# Patient Record
Sex: Female | Born: 1945 | Race: White | Hispanic: No | Marital: Single | Smoking: Former smoker
Health system: Southern US, Community
[De-identification: ages and names within clinical notes are randomized; demographics above are authoritative.]

## PROBLEM LIST (undated history)

## (undated) DIAGNOSIS — R600 Localized edema: Secondary | ICD-10-CM

## (undated) DIAGNOSIS — I8390 Asymptomatic varicose veins of unspecified lower extremity: Secondary | ICD-10-CM

## (undated) DIAGNOSIS — G8929 Other chronic pain: Secondary | ICD-10-CM

## (undated) DIAGNOSIS — R32 Unspecified urinary incontinence: Secondary | ICD-10-CM

## (undated) DIAGNOSIS — M1990 Unspecified osteoarthritis, unspecified site: Secondary | ICD-10-CM

## (undated) DIAGNOSIS — I872 Venous insufficiency (chronic) (peripheral): Secondary | ICD-10-CM

## (undated) DIAGNOSIS — R519 Headache, unspecified: Secondary | ICD-10-CM

## (undated) DIAGNOSIS — I809 Phlebitis and thrombophlebitis of unspecified site: Secondary | ICD-10-CM

## (undated) DIAGNOSIS — R51 Headache: Secondary | ICD-10-CM

## (undated) DIAGNOSIS — H269 Unspecified cataract: Secondary | ICD-10-CM

## (undated) DIAGNOSIS — R0602 Shortness of breath: Secondary | ICD-10-CM

## (undated) DIAGNOSIS — I493 Ventricular premature depolarization: Secondary | ICD-10-CM

## (undated) HISTORY — DX: Ventricular premature depolarization: I49.3

## (undated) HISTORY — DX: Shortness of breath: R06.02

## (undated) HISTORY — PX: OTHER SURGICAL HISTORY: SHX169

## (undated) HISTORY — DX: Unspecified urinary incontinence: R32

## (undated) HISTORY — PX: NOSE SURGERY: SHX723

## (undated) HISTORY — DX: Unspecified osteoarthritis, unspecified site: M19.90

## (undated) HISTORY — DX: Other chronic pain: G89.29

## (undated) HISTORY — DX: Headache, unspecified: R51.9

## (undated) HISTORY — DX: Venous insufficiency (chronic) (peripheral): I87.2

## (undated) HISTORY — DX: Asymptomatic varicose veins of unspecified lower extremity: I83.90

## (undated) HISTORY — PX: HERNIA REPAIR: SHX51

## (undated) HISTORY — DX: Localized edema: R60.0

## (undated) HISTORY — DX: Unspecified cataract: H26.9

## (undated) HISTORY — DX: Phlebitis and thrombophlebitis of unspecified site: I80.9

## (undated) HISTORY — DX: Headache: R51

## (undated) HISTORY — PX: CHOLECYSTECTOMY: SHX55

---

## 1974-12-06 HISTORY — PX: TUBAL LIGATION: SHX77

## 1998-03-25 ENCOUNTER — Other Ambulatory Visit: Admission: RE | Admit: 1998-03-25 | Discharge: 1998-03-25 | Payer: Self-pay | Admitting: *Deleted

## 1998-04-14 ENCOUNTER — Other Ambulatory Visit: Admission: RE | Admit: 1998-04-14 | Discharge: 1998-04-14 | Payer: Self-pay | Admitting: *Deleted

## 2000-12-06 HISTORY — PX: KNEE ARTHROSCOPY: SUR90

## 2001-04-23 ENCOUNTER — Ambulatory Visit (HOSPITAL_COMMUNITY): Admission: RE | Admit: 2001-04-23 | Discharge: 2001-04-23 | Payer: Self-pay | Admitting: Orthopedic Surgery

## 2001-04-23 ENCOUNTER — Encounter: Payer: Self-pay | Admitting: Orthopedic Surgery

## 2001-06-02 ENCOUNTER — Ambulatory Visit (HOSPITAL_COMMUNITY): Admission: RE | Admit: 2001-06-02 | Discharge: 2001-06-02 | Payer: Self-pay | Admitting: Orthopedic Surgery

## 2004-07-11 ENCOUNTER — Emergency Department (HOSPITAL_COMMUNITY): Admission: EM | Admit: 2004-07-11 | Discharge: 2004-07-11 | Payer: Self-pay | Admitting: Emergency Medicine

## 2005-01-13 ENCOUNTER — Encounter: Payer: Self-pay | Admitting: *Deleted

## 2005-12-12 ENCOUNTER — Emergency Department (HOSPITAL_COMMUNITY): Admission: EM | Admit: 2005-12-12 | Discharge: 2005-12-12 | Payer: Self-pay | Admitting: Emergency Medicine

## 2008-02-13 ENCOUNTER — Ambulatory Visit (HOSPITAL_BASED_OUTPATIENT_CLINIC_OR_DEPARTMENT_OTHER): Admission: RE | Admit: 2008-02-13 | Discharge: 2008-02-13 | Payer: Self-pay | Admitting: Urology

## 2008-03-29 ENCOUNTER — Ambulatory Visit (HOSPITAL_BASED_OUTPATIENT_CLINIC_OR_DEPARTMENT_OTHER): Admission: RE | Admit: 2008-03-29 | Discharge: 2008-03-29 | Payer: Self-pay | Admitting: Urology

## 2008-05-07 ENCOUNTER — Ambulatory Visit (HOSPITAL_BASED_OUTPATIENT_CLINIC_OR_DEPARTMENT_OTHER): Admission: RE | Admit: 2008-05-07 | Discharge: 2008-05-07 | Payer: Self-pay | Admitting: Urology

## 2008-09-06 ENCOUNTER — Emergency Department (HOSPITAL_COMMUNITY): Admission: EM | Admit: 2008-09-06 | Discharge: 2008-09-07 | Payer: Self-pay | Admitting: Emergency Medicine

## 2010-09-28 ENCOUNTER — Encounter: Admission: RE | Admit: 2010-09-28 | Discharge: 2010-09-28 | Payer: Self-pay | Admitting: Obstetrics and Gynecology

## 2010-11-10 ENCOUNTER — Encounter: Admission: RE | Admit: 2010-11-10 | Discharge: 2010-11-10 | Payer: Self-pay | Admitting: Psychiatry

## 2011-04-14 ENCOUNTER — Encounter (INDEPENDENT_AMBULATORY_CARE_PROVIDER_SITE_OTHER): Payer: Self-pay | Admitting: General Surgery

## 2011-04-20 NOTE — Op Note (Signed)
NAME:  Meghan Griffith, Meghan Griffith          ACCOUNT NO.:  [id number]   MEDICAL RECORD NO.:  [id number]          PATIENT TYPE:  AMB   LOCATION:  [facility]                         FACILITY:  [facility]   PHYSICIAN:  Martina Sinner, MD DATE OF BIRTH:  1946/04/27   DATE OF PROCEDURE:  DATE OF DISCHARGE:                               OPERATIVE REPORT   PREOPERATIVE DIAGNOSIS:  Stress incontinence.   POSTOPERATIVE DIAGNOSIS:  Stress incontinence.   SURGERY:  Cystoscopy, transurethral collagen injection therapy.   SURGEON:  Martina Sinner, MD   Ms. Nase responded well to her first collagen treatment, and we  are trying to get her as dry as possible.   The patient was prepped and draped in the usual fashion.  The ACMI scope  was used for the examination.  Bladder mucosa and trigone were normal.  There is no __________ deformity or bladder carcinoma.  She had a very  long urethra with descensus.  I could see residual collagen.  I injected  it at 5 and 7 o'clock, two syringes of collagen.  I was very happy with  the coaptation.  The bladder was emptied with a red rubber catheter.  Hopefully, this will help her reach her treatment goal.           ______________________________  Martina Sinner, MD  Electronically Signed     SAM/MEDQ  D:  05/07/2008  T:  05/07/2008  Job:  [id number]

## 2011-04-20 NOTE — Op Note (Signed)
NAME:  Meghan Griffith, Meghan Griffith          ACCOUNT NO.:  [id number]   MEDICAL RECORD NO.:  [id number]          PATIENT TYPE:  AMB   LOCATION:  [facility]                         FACILITY:  [facility]   PHYSICIAN:  Martina Sinner, MD DATE OF BIRTH:  October 25, 1946   DATE OF PROCEDURE:  03/29/2008  DATE OF DISCHARGE:                               OPERATIVE REPORT   PREOPERATIVE DIAGNOSIS:  Stress incontinence.   POSTOPERATIVE DIAGNOSIS:  Stress incontinence.   SURGERY:  Cystoscopy, transurethral collagen injection therapy.   SURGEON:  Martina Sinner, M.D.   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   She was prepped and draped in the usual fashion.  The ACMI scope was  used for the examination.  There was no erosion into the urethra or  bladder.  Bladder mucosa and trigone were normal.  I injected three  syringes of collagen at 5 and 7 o'clock.  The first and third syringe  went beautifully with excellent coaptation.   A 14-French red rubber catheter was inserted into her bladder and her  bladder was emptied.  The patient was covered with antibiotics and taken  to the recovery room.           ______________________________  Martina Sinner, MD  Electronically Signed     SAM/MEDQ  D:  03/29/2008  T:  03/29/2008  Job:  [id number]

## 2011-04-20 NOTE — Op Note (Signed)
NAME:  Meghan Griffith, Meghan Griffith          ACCOUNT NO.:  [id number]   MEDICAL RECORD NO.:  [id number]          PATIENT TYPE:  AMB   LOCATION:  [facility]                         FACILITY:  [facility]   PHYSICIAN:  Martina Sinner, MD DATE OF BIRTH:  11/12/1946   DATE OF PROCEDURE:  02/13/2008  DATE OF DISCHARGE:                               OPERATIVE REPORT   PREOPERATIVE DIAGNOSIS:  Stress incontinence.   POSTOPERATIVE DIAGNOSIS:  Stress incontinence.   OPERATION:  Sling cystourethropexy [facility]) plus cystoscopy.   SURGEON:  Martina Sinner, M.D.   ASSISTANT:  Delman Kitten   INDICATIONS FOR PROCEDURE:  Meghan Griffith has mixed stress and urge  incontinence, but primarily stress incontinence refractory to medical  and behavioral therapy.  She consented to a sling.   PROCEDURE IN DETAIL:  The patient is prepped and draped in the usual  fashion.  Preoperative laboratory tests were normal.  She was given  preoperative antibiotics.  Extra care was taken with leg positioning to  minimize the risks of compartment syndrome, neuropathy and DVT.  I  initially made two 1 cm incisions 1.5 cm lateral to the midline one  fingerbreadth above the symphysis pubis.  I then made a 2 cm incision  overlying the mid urethra.  I used approximately 7 mL of lidocaine and  epinephrine mixture.  I made a thick incision to allow for a thick  pubocervical flap.  I sharply dissected to the urethrovesical angle  bilaterally.   With the bladder empty, I passed a SPARC needle on top of and along the  back of the symphysis pubis parallel to the midline.  I delivered the  trocar onto the pulp of my index finger.  I did the same procedure on  both sides.  I then cystoscoped the patient.  There was no injury to the  bladder.  There was no deflection of the bladder with wiggling of the  trocar.  There was efflux of indigo carmine bilaterally.  Examination of  the urethra was normal.  With the bladder empty, I attached the  [facility]  sling and brought it up through the retropubic space.  I tensioned it  over fat part of a Kelly clamp.  I cut below the blue dots, irrigated  the sheath, and removed the sheath.  I was very happy with the tension  and position of the sling.  There was hypermobility in the midline.  There was no spring back effect.   I closed the anterior vaginal wall incision with running 2-0 Vicryl on a  CT1 needle.  I used two interrupted sutures.  I cut the sling below the  skin and closed the skin with 4-0 Vicryl subcuticular and Dermabond.  I  was very pleased  with the procedure.  Blood loss was less than 50 mL.  A vaginal pack was  inserted.  The Foley catheter was clamped off at the end of the case  with an empty bladder.  The leg position was good.  I am hoping this  operation will reach the patient's treatment goal.           ______________________________  Martina Sinner, MD  Electronically Signed     SAM/MEDQ  D:  02/13/2008  T:  02/13/2008  Job:  [id number]

## 2011-07-19 ENCOUNTER — Ambulatory Visit (HOSPITAL_COMMUNITY)
Admission: RE | Admit: 2011-07-19 | Discharge: 2011-07-19 | Disposition: A | Discharge status: Home / Self Care | Payer: BC Managed Care – PPO | Source: Ambulatory Visit | Attending: General Surgery | Admitting: General Surgery

## 2011-07-19 ENCOUNTER — Other Ambulatory Visit (INDEPENDENT_AMBULATORY_CARE_PROVIDER_SITE_OTHER): Payer: Self-pay | Admitting: General Surgery

## 2011-07-19 ENCOUNTER — Encounter (HOSPITAL_COMMUNITY)
Admission: RE | Admit: 2011-07-19 | Discharge: 2011-07-19 | Disposition: A | Discharge status: Home / Self Care | Payer: BC Managed Care – PPO | Source: Ambulatory Visit | Attending: General Surgery | Admitting: General Surgery

## 2011-07-19 DIAGNOSIS — K829 Disease of gallbladder, unspecified: Secondary | ICD-10-CM | POA: Insufficient documentation

## 2011-07-19 DIAGNOSIS — Z01811 Encounter for preprocedural respiratory examination: Secondary | ICD-10-CM

## 2011-07-19 DIAGNOSIS — Z01810 Encounter for preprocedural cardiovascular examination: Secondary | ICD-10-CM | POA: Insufficient documentation

## 2011-07-19 DIAGNOSIS — I498 Other specified cardiac arrhythmias: Secondary | ICD-10-CM | POA: Insufficient documentation

## 2011-07-19 DIAGNOSIS — Z01812 Encounter for preprocedural laboratory examination: Secondary | ICD-10-CM | POA: Insufficient documentation

## 2011-07-19 DIAGNOSIS — Z01818 Encounter for other preprocedural examination: Secondary | ICD-10-CM | POA: Insufficient documentation

## 2011-07-19 LAB — DIFFERENTIAL
Basophils Absolute: 0 10*3/uL (ref 0.0–0.1)
Basophils Relative: 0 % (ref 0–1)
Eosinophils Absolute: 0.1 10*3/uL (ref 0.0–0.7)
Eosinophils Relative: 1 % (ref 0–5)
Lymphocytes Relative: 25 % (ref 12–46)
Lymphs Abs: 2.3 10*3/uL (ref 0.7–4.0)
Monocytes Absolute: 0.5 10*3/uL (ref 0.1–1.0)
Monocytes Relative: 5 % (ref 3–12)
Neutro Abs: 6.1 10*3/uL (ref 1.7–7.7)
Neutrophils Relative %: 68 % (ref 43–77)

## 2011-07-19 LAB — COMPREHENSIVE METABOLIC PANEL
AST: 19 U/L (ref 0–37)
Albumin: 4.3 g/dL (ref 3.5–5.2)
Alkaline Phosphatase: 107 U/L (ref 39–117)
CO2: 26 mEq/L (ref 19–32)
Chloride: 105 mEq/L (ref 96–112)
Potassium: 4 mEq/L (ref 3.5–5.1)
Total Bilirubin: 0.3 mg/dL (ref 0.3–1.2)

## 2011-07-19 LAB — CBC
HCT: 43.4 % (ref 36.0–46.0)
Platelets: 133 10*3/uL — ABNORMAL LOW (ref 150–400)
RBC: 4.9 MIL/uL (ref 3.87–5.11)
RDW: 13.1 % (ref 11.5–15.5)
WBC: 9 10*3/uL (ref 4.0–10.5)

## 2011-07-22 ENCOUNTER — Telehealth: Payer: Self-pay | Admitting: *Deleted

## 2011-07-22 NOTE — Telephone Encounter (Signed)
Call Back Phone#: [phone] Last OV

## 2011-07-26 ENCOUNTER — Telehealth (INDEPENDENT_AMBULATORY_CARE_PROVIDER_SITE_OTHER): Payer: Self-pay | Admitting: General Surgery

## 2011-07-26 NOTE — Telephone Encounter (Signed)
I faxed everything (OV, ECHO and NUC) possible on 07/23/11

## 2011-07-26 NOTE — Telephone Encounter (Signed)
Labs ok for surgery faxed to [facility].

## 2011-07-26 NOTE — Telephone Encounter (Signed)
Message copied by Liliana Cline on Mon Jul 26, 2011  2:59 PM ------      Message from: Andrey Campanile, ERIC M      Created: Mon Jul 26, 2011  2:31 PM       Labs ok for surgery

## 2011-07-30 ENCOUNTER — Ambulatory Visit (HOSPITAL_COMMUNITY): Payer: BC Managed Care – PPO

## 2011-07-30 ENCOUNTER — Other Ambulatory Visit (INDEPENDENT_AMBULATORY_CARE_PROVIDER_SITE_OTHER): Payer: Self-pay | Admitting: General Surgery

## 2011-07-30 ENCOUNTER — Ambulatory Visit (HOSPITAL_COMMUNITY)
Admission: RE | Admit: 2011-07-30 | Discharge: 2011-07-30 | Disposition: A | Discharge status: Home / Self Care | Payer: BC Managed Care – PPO | Source: Ambulatory Visit | Attending: General Surgery | Admitting: General Surgery

## 2011-07-30 DIAGNOSIS — K8020 Calculus of gallbladder without cholecystitis without obstruction: Secondary | ICD-10-CM | POA: Insufficient documentation

## 2011-07-30 DIAGNOSIS — F17200 Nicotine dependence, unspecified, uncomplicated: Secondary | ICD-10-CM | POA: Insufficient documentation

## 2011-07-30 DIAGNOSIS — K8010 Calculus of gallbladder with chronic cholecystitis without obstruction: Secondary | ICD-10-CM

## 2011-07-30 HISTORY — PX: LAPAROSCOPIC CHOLECYSTECTOMY W/ CHOLANGIOGRAPHY: SUR757

## 2011-08-02 ENCOUNTER — Telehealth (INDEPENDENT_AMBULATORY_CARE_PROVIDER_SITE_OTHER): Payer: Self-pay | Admitting: General Surgery

## 2011-08-02 NOTE — Telephone Encounter (Signed)
appt made for 08/13/11 @ 11 am.

## 2011-08-12 NOTE — Op Note (Signed)
NAMEMarland Kitchen  Meghan Griffith, Meghan Griffith          ACCOUNT NO.:  [id number]  MEDICAL RECORD NO.:  [id number]  LOCATION:  [facility]                         FACILITY:  [facility]  PHYSICIAN:  [name]. Andrey Campanile, MD     DATE OF BIRTH:  07-14-1946  DATE OF PROCEDURE:  07/30/2011 DATE OF DISCHARGE:                              OPERATIVE REPORT   PREOPERATIVE DIAGNOSIS:  Symptomatic cholelithiasis.  POSTOPERATIVE DIAGNOSIS:  Symptomatic cholelithiasis.  PROCEDURE:  Laparoscopic cholecystectomy with intraoperative cholangiogram.  SURGEON:  [name]. Andrey Campanile, MD  ASSISTANT SURGEON:  None.  ANESTHESIA:  General plus 18 mL of 0.25% Marcaine with epi.  SPECIMEN:  Gallbladder.  ESTIMATED BLOOD LOSS:  Minimal.  FINDINGS:  The patient had a fair number of omental attachments to the body of the gallbladder.  The critical view was obtained.  The cholangiogram demonstrated prompt opacification of the cystic duct, common hepatic, common bile duct, left and right hepatic ducts as well as emptying into the duodenum.  There was no evidence of filling defects.  There was a little bit of extravasation of contrast as it entered the cystic duct.  I did place surgical snow in the gallbladder fossa.  DRAINS:  None.  INDICATIONS FOR PROCEDURE:  The patient is a [name] who has had intermittent abdominal pain for several years now. The pain is mainly located on the right side underneath the rib cage and radiates to her epigastrium.  It can be related to certain foods particularly spicy foods.  She underwent an ultrasound, which demonstrated multiple gallstones.  We discussed the risks and benefits of surgery including, but not limited to bleeding, infection, injury to surrounding structures, bile leak, prolonged diarrhea, injury to the common bile duct requiring major reconstructive bile duct surgery, retained gallstones, bile leak, DVT occurrence, and general anesthesia risk.  The patient elects to  proceed to surgery.  DESCRIPTION OF PROCEDURE:  After obtaining informed consent, the patient was brought to the operating room, placed supine on the operating table. General endotracheal anesthesia was established.  Sequential compression devices were placed.  Surgical time-out was performed.  She received antibiotics prior to skin incision.  Abdomen was prepped and draped in usual standard surgical fashion with ChloraPrep.  I infiltrated local at the base of her umbilicus.  Next, a 1.5 cm vertical infraumbilical incision was made with an #11 blade.  The fascia was grasped and lifted anteriorly.  The fascia was incised with #11-blade.  The abdominal cavity was entered.  A pursestring suture consisting of 0-Vicryl was placed around the fascial edges and a 12-mm Hasson trocar was placed under direct visualization into the abdominal cavity.  Pneumoperitoneum was smoothly established up to a patient pressure of 15 mmHg. Laparoscope was advanced.  The patient was placed in reverse Trendelenburg and rotated to the left.  Three additional 5-mm trocars were placed all under direct visualization after local had been infiltrated, one in the subxiphoid position and two in the right hypochondrium.  The gallbladder was grasped and lifted up.  She had a fair amount of omental attachments to the body of the gallbladder.  This was gently stripped down in a blunt fashion as well as taken down with hook electrocautery.  The  neck was grasped and retracted laterally.  I then incised the peritoneum overlying the medial and lateral aspects of the gallbladder.  Then using a [place], I circumferentially dissected out the cystic duct as well as the cystic artery.  The critical view was obtained.  A clip was placed across the distal cystic duct as it entered the gallbladder.  It was then partially transected. [facility] cholangiogram catheter was then placed percutaneously through the abdominal wall and  threaded into the cystic duct and secured with a clip.  The cholangiogram was performed as described above. Pneumoperitoneum was reestablished.  The clip securing the catheter was removed along with the cholangiogram catheter.  Three clips were placed on the biliary aspect of the gallbladder on the cystic duct.  It was then transected with EndoShears.  Two clips were placed on the proximal side of the cystic artery and then it was transected distally with hook electrocautery.  I then rolled the gallbladder out of the gallbladder fossa.  As we neared the top of the edge of the liver, there was some bleeding from the gallbladder fossa.  I finished removing the gallbladder from the gallbladder fossa.  The laparoscope was placed in the subxiphoid trocar.  The endobag was advanced to the umbilical trocar.  This gallbladder was placed in the specimen bag and removed the abdominal cavity.  Pneumoperitoneum was reestablished.  I then lifted up the liver.  There were two areas that were oozing so I turned up the hook electrocautery to 60 and obtained hemostasis.  I then irrigated the right upper quadrant with a liter and half of saline.  There was no evidence of bile leak.  The bleeding was controlled, however, I did elect to place a piece of surgical snow in the gallbladder fossa. I then removed the Hasson trocar and tied down the previously placed pursestring suture.  I placed an additional single interrupted 0-Vicryl suture, this was performed and viewed laparoscopically.  There was nothing within our fascial closure.  There was no air leak at the umbilicus.  Pneumoperitoneum was released.  Remaining skin incisions were closed with 4-0 Monocryl in subcuticular fashion followed by application of Benzoin, Steri-Strips, and sterile bandages.  The patient was extubated and taken to the recovery room in a stable condition. There were no immediate complications.  All needle, instrument, and sponge  counts were correct x2.     [name]. Andrey Campanile, MD     EMW/MEDQ  D:  07/30/2011  T:  07/30/2011  Job:  [id number]  cc:   Ralene Ok, M.D.  Electronically Signed by Gaynelle Adu M.D. on 08/12/2011 02:04:56 PM

## 2011-08-13 ENCOUNTER — Ambulatory Visit (INDEPENDENT_AMBULATORY_CARE_PROVIDER_SITE_OTHER): Payer: BC Managed Care – PPO | Admitting: General Surgery

## 2011-08-13 ENCOUNTER — Encounter (INDEPENDENT_AMBULATORY_CARE_PROVIDER_SITE_OTHER): Payer: Self-pay | Admitting: General Surgery

## 2011-08-13 VITALS — BP 134/96 | HR 64

## 2011-08-13 DIAGNOSIS — Z09 Encounter for follow-up examination after completed treatment for conditions other than malignant neoplasm: Secondary | ICD-10-CM

## 2011-08-13 NOTE — Patient Instructions (Signed)
Try taking a stool softner for several days & increasing your water intake to help with your constipation

## 2011-08-13 NOTE — Progress Notes (Signed)
Chief complaint: Postop  Procedure: Laparoscopic cholecystectomy with interoperative cholangiogram on July 30, 2011  History of Present Ilness: [name] comes in today for her first postoperative appointment. She states that she's been very well since surgery. She denies any fevers or chills. She denies any nausea or vomiting. She denies any abdominal pain. She reports a good appetite. She reports a little constipation. She states that she's having a bowel movement every other day. She denies taking any narcotics.  Physical Exam: BP 134/96  Pulse 64  Well-developed well-nourished overweight Caucasian female in no apparent distress Pulmonary-lungs are clear to auscultation Cardiac-regular rate and rhythm Abdomen-soft, nontender, nondistended. Well-healed trocar incisions. No signs of cellulitis or surgical site wound infection. No signs of incisional hernia Skin-no jaundice or edema  Pathology: Pathology showed chronic cholecystitis and cholelithiasis  Assessment and Plan: Status post laparoscopic cholecystectomy with intraoperative cholangiogram-doing well  We discussed her pathology report.  I advised her to take stool softners & increase her water intake to help with her infrequent stools.  I gave her a note returning to work next week.  I will see on a prn basis

## 2011-08-30 LAB — TYPE AND SCREEN: ABO/RH(D): O POS

## 2011-08-30 LAB — CBC
HCT: 42
Hemoglobin: 14.9
MCHC: 35.4
MCV: 86.8
RDW: 13.3

## 2011-08-30 LAB — DIFFERENTIAL
Basophils Absolute: 0
Basophils Relative: 0
Eosinophils Absolute: 0.1
Eosinophils Relative: 1
Monocytes Absolute: 0.5

## 2011-08-30 LAB — ABO/RH: ABO/RH(D): O POS

## 2011-08-31 LAB — POCT HEMOGLOBIN-HEMACUE: Operator id: 268271

## 2011-09-02 LAB — POCT I-STAT, CHEM 8
Glucose, Bld: 82
HCT: 53 — ABNORMAL HIGH
Hemoglobin: 18 — ABNORMAL HIGH
Potassium: 3.2 — ABNORMAL LOW
Sodium: 145

## 2011-09-06 LAB — DIFFERENTIAL
Basophils Relative: 1
Eosinophils Absolute: 0
Eosinophils Relative: 0
Monocytes Absolute: 0.4
Monocytes Relative: 4

## 2011-09-06 LAB — CBC
Hemoglobin: 15
RDW: 13.6

## 2011-09-06 LAB — COMPREHENSIVE METABOLIC PANEL
ALT: 69 — ABNORMAL HIGH
AST: 156 — ABNORMAL HIGH
Albumin: 3.9
Alkaline Phosphatase: 102
Glucose, Bld: 116 — ABNORMAL HIGH
Potassium: 4.7
Sodium: 135
Total Protein: 7

## 2012-01-25 ENCOUNTER — Encounter: Payer: Self-pay | Admitting: Cardiology

## 2012-01-26 ENCOUNTER — Ambulatory Visit (INDEPENDENT_AMBULATORY_CARE_PROVIDER_SITE_OTHER): Payer: BC Managed Care – PPO | Admitting: Pulmonary Disease

## 2012-01-26 ENCOUNTER — Encounter: Payer: Self-pay | Admitting: Pulmonary Disease

## 2012-01-26 VITALS — BP 120/86 | HR 74 | Temp 97.4°F | Ht 67.0 in | Wt 200.2 lb

## 2012-01-26 DIAGNOSIS — R0600 Dyspnea, unspecified: Secondary | ICD-10-CM

## 2012-01-26 DIAGNOSIS — R0609 Other forms of dyspnea: Secondary | ICD-10-CM

## 2012-01-26 DIAGNOSIS — R0989 Other specified symptoms and signs involving the circulatory and respiratory systems: Secondary | ICD-10-CM

## 2012-01-26 NOTE — Patient Instructions (Signed)
Your breathing tests and oxygen levels are normal today.  You do not have copd. Will check scan of your chest to make sure you have not had blood clots to lungs.  Will call you with results. If this is unremarkable, and your shortness of breath persists, you may need a cardiac evaluation thru your primary doctor.

## 2012-01-26 NOTE — Progress Notes (Signed)
  Subjective:    Patient ID: Meghan Griffith, female    DOB: 1946/10/05, 66 y.o.   MRN: [id number]  HPI The patient is a 66 year old female who I've been asked to see for dyspnea on exertion.  The patient has a long-standing history of tobacco use, however felt that her breathing was fairly normal until approximately 3 days ago.  She began to have dyspnea on exertion to the point that she had difficulty keeping up with the walking involved in her job.  She does not feel that her shortness of breath has been incremental over the last 3 days.  She denies any shortness of breath at rest.  She has a mild chronic cough that is dry, and it has not changed.  She denies any chest pressure, pleuritic chest pain, or wheezing.  She does have some chronic lower extremity edema due to a history of phlebitis, but has never had a DVT or pulmonary embolus.  She denies any known history of heart disease.  She did bring a disc of her recent chest x-ray, and this is over penetrated.  It does raise the question of apical bulla, but does not appear to have any acute process.   Review of Systems  Constitutional: Positive for unexpected weight change. Negative for fever.  HENT: Positive for ear pain and sneezing. Negative for nosebleeds, congestion, sore throat, rhinorrhea, trouble swallowing, dental problem, postnasal drip and sinus pressure.   Eyes: Negative for redness and itching.  Respiratory: Positive for cough and shortness of breath. Negative for chest tightness and wheezing.   Cardiovascular: Negative for palpitations and leg swelling.  Gastrointestinal: Negative for nausea and vomiting.  Genitourinary: Negative for dysuria.  Musculoskeletal: Positive for joint swelling.  Skin: Negative for rash.  Neurological: Positive for headaches.  Hematological: Does not bruise/bleed easily.  Psychiatric/Behavioral: Negative for dysphoric mood. The patient is not nervous/anxious.        Objective:   Physical  Exam Constitutional:  Well developed, no acute distress  HENT:  Nares patent without discharge  Oropharynx without exudate, palate and uvula are normal  Eyes:  Perrla, eomi, no scleral icterus  Neck:  No JVD, no TMG  Cardiovascular:  Normal rate, regular rhythm, no rubs or gallops.  No murmurs        Intact distal pulses  Pulmonary :  Normal breath sounds, no stridor or respiratory distress   No rales, rhonchi, or wheezing  Abdominal:  Soft, nondistended, bowel sounds present.  No tenderness noted.   Musculoskeletal: 1+ lower extremity edema noted, prominent varicosities.  No calf tenderness.   Lymph Nodes:  No cervical lymphadenopathy noted  Skin:  No cyanosis noted  Neurologic:  Alert, appropriate, moves all 4 extremities without obvious deficit. .        Assessment & Plan:

## 2012-01-26 NOTE — Assessment & Plan Note (Signed)
The patient gives a history for acute dyspnea on exertion that started approximately 3 days.  There is no historical information that is suggestive of an obvious pulmonary process, and her spirometry today is totally normal.  Her lungs are totally clear, and her sats are normal both at rest and with exertion.  My only concern is that she has a history of phlebitis, and clearly has lower extremity edema today with prominent varicosities.  I would wonder whether she could have an occult pulmonary embolus.  The other consideration is whether this may be related to a heart issue.  At this point, I would like to exclude the possibility of thromboembolic disease, and we'll therefore schedule her for a CT angio.  If this is unremarkable, I think she may need a cardiac workup through her primary care physician's office.

## 2012-01-27 ENCOUNTER — Telehealth: Payer: Self-pay | Admitting: Pulmonary Disease

## 2012-01-27 ENCOUNTER — Ambulatory Visit (INDEPENDENT_AMBULATORY_CARE_PROVIDER_SITE_OTHER)
Admission: RE | Admit: 2012-01-27 | Discharge: 2012-01-27 | Disposition: A | Discharge status: Home / Self Care | Payer: BC Managed Care – PPO | Source: Ambulatory Visit | Attending: Pulmonary Disease | Admitting: Pulmonary Disease

## 2012-01-27 DIAGNOSIS — R0600 Dyspnea, unspecified: Secondary | ICD-10-CM

## 2012-01-27 DIAGNOSIS — R0609 Other forms of dyspnea: Secondary | ICD-10-CM

## 2012-01-27 MED ORDER — IOHEXOL 300 MG/ML  SOLN
80.0000 mL | Freq: Once | INTRAMUSCULAR | Status: AC | PRN
  Administered 2012-01-27: 80 mL via INTRAVENOUS

## 2012-01-27 NOTE — Telephone Encounter (Signed)
IMPRESSION:  No acute intrathoracic pathology. No evidence of acute pulmonary  thromboembolism   There is no sign of PE on chest CT. Will sign off on phone note from [facility] and per [facility] she will let Dr. Shelle Iron know results are available when he is back in the office on Fri., 01/28/12.

## 2012-02-03 ENCOUNTER — Other Ambulatory Visit: Payer: Self-pay

## 2012-02-03 ENCOUNTER — Emergency Department (HOSPITAL_COMMUNITY): Payer: BC Managed Care – PPO

## 2012-02-03 ENCOUNTER — Emergency Department (HOSPITAL_COMMUNITY)
Admission: EM | Admit: 2012-02-03 | Discharge: 2012-02-03 | Disposition: A | Discharge status: Home / Self Care | Payer: BC Managed Care – PPO | Attending: Emergency Medicine | Admitting: Emergency Medicine

## 2012-02-03 ENCOUNTER — Encounter (HOSPITAL_COMMUNITY): Payer: Self-pay | Admitting: Emergency Medicine

## 2012-02-03 DIAGNOSIS — I872 Venous insufficiency (chronic) (peripheral): Secondary | ICD-10-CM | POA: Insufficient documentation

## 2012-02-03 DIAGNOSIS — R0602 Shortness of breath: Secondary | ICD-10-CM | POA: Insufficient documentation

## 2012-02-03 DIAGNOSIS — M7989 Other specified soft tissue disorders: Secondary | ICD-10-CM | POA: Insufficient documentation

## 2012-02-03 DIAGNOSIS — H269 Unspecified cataract: Secondary | ICD-10-CM | POA: Insufficient documentation

## 2012-02-03 DIAGNOSIS — R531 Weakness: Secondary | ICD-10-CM

## 2012-02-03 DIAGNOSIS — R5381 Other malaise: Secondary | ICD-10-CM | POA: Insufficient documentation

## 2012-02-03 LAB — URINALYSIS, ROUTINE W REFLEX MICROSCOPIC
Glucose, UA: NEGATIVE mg/dL
Ketones, ur: NEGATIVE mg/dL
Leukocytes, UA: NEGATIVE
Nitrite: NEGATIVE
Protein, ur: NEGATIVE mg/dL
pH: 5.5 (ref 5.0–8.0)

## 2012-02-03 LAB — POCT I-STAT TROPONIN I

## 2012-02-03 LAB — COMPREHENSIVE METABOLIC PANEL
ALT: 12 U/L (ref 0–35)
AST: 20 U/L (ref 0–37)
Albumin: 4.4 g/dL (ref 3.5–5.2)
Calcium: 10 mg/dL (ref 8.4–10.5)
Creatinine, Ser: 0.83 mg/dL (ref 0.50–1.10)
Sodium: 138 mEq/L (ref 135–145)

## 2012-02-03 LAB — CBC
Hemoglobin: 16.3 g/dL — ABNORMAL HIGH (ref 12.0–15.0)
MCH: 30.2 pg (ref 26.0–34.0)
MCHC: 34.5 g/dL (ref 30.0–36.0)
Platelets: 136 10*3/uL — ABNORMAL LOW (ref 150–400)
RBC: 5.4 MIL/uL — ABNORMAL HIGH (ref 3.87–5.11)

## 2012-02-03 NOTE — Discharge Instructions (Signed)
Return to the ED with any concerns including chest pain, fainting, vomiting and not able to keep down liquids, difficulty breathing, decreased level of alertness or lethargy, or any other alarming symptoms.

## 2012-02-03 NOTE — ED Notes (Signed)
Pt SOB since 2/19 and tired.  Pt recently quit smoking.  Denies N/V denies history of cardiac issues.

## 2012-02-03 NOTE — ED Notes (Signed)
Sob since last Tuesday states stopped smoking 2 days ago

## 2012-02-03 NOTE — ED Provider Notes (Signed)
History     CSN: [id number]  Arrival date & time 02/03/12  4098   First MD Initiated Contact with Patient 02/03/12 0940      Chief Complaint  Patient presents with  . Shortness of Breath    (Consider location/radiation/quality/duration/timing/severity/associated sxs/prior treatment) HPI Patient presents with complaint of generalized weakness. She also states that she has some mild difficulty breathing. She states the symptoms have been ongoing for the past approximately 2 weeks. She saw Dr. Shelle Iron one week ago who determined that her lung exam was clear and had some concern about an occult pulmonary embolism. She had a CT angiography chest performed one week ago which was negative for PE without any acute findings. She states that she continues to feel fatigued and generally weak. She has not had any fevers or chills, no chest pain no change in the swelling in her lower extremities (she does have chronic venous stasis). She's had no nausea or vomiting or abdominal pain. She states that she has been eating and drinking normally. Patient is concerned because she has had to miss some days of work due to her symptoms. She has been advised to followup with cardiology and has an appointment with them on March 19. There no other associated symptoms. There no other alleviating or modifying factors.  Past Medical History  Diagnosis Date  . Cataracts, bilateral   . Incontinence   . Chronic headache   . Phlebitis     Past Surgical History  Procedure Date  . Vaginal sling   . Knee arthroscopy 2002    left knee  . Collagen     injections  . Laparoscopic cholecystectomy w/ cholangiography 07/30/11  . Tubal ligation 1976    Family History  Problem Relation Age of Onset  . Heart disease Mother   . COPD Mother   . Diabetes Mother   . Heart disease Father   . COPD Father   . Cancer Sister     lung  . COPD Sister   . Heart disease Sister   . Diabetes Brother   . Rheum arthritis Mother       History  Substance Use Topics  . Smoking status: Current Everyday Smoker -- 1.0 packs/day for 28 years    Last Attempt to Quit: 01/25/2012  . Smokeless tobacco: Never Used  . Alcohol Use: No    OB History    Grav Para Term Preterm Abortions TAB SAB Ect Mult Living                  Review of Systems ROS reviewed and otherwise negative except for mentioned in HPI  Allergies  Prevacid; Tetracyclines & related; and Prednisone  Home Medications   Current Outpatient Rx  Name Route Sig Dispense Refill  . ASPIRIN EC 81 MG PO TBEC Oral Take 81 mg by mouth daily.    Marland Kitchen HYDROCHLOROTHIAZIDE 25 MG PO TABS Oral Take 25 mg by mouth daily.       BP 117/83  Pulse 86  Temp(Src) 98.6 F (37 C) (Oral)  Resp 13  SpO2 100% Vitals reviewed Physical Exam Physical Examination: General appearance - alert, concerned appearing, and in no distress Mental status - alert, oriented to person, place, and time Eyes - pupils equal and reactive, no scleral icterus Mouth - mucous membranes moist, pharynx normal without lesions Chest - clear to auscultation, no wheezes, rales or rhonchi, symmetric air entry Heart - normal rate, regular rhythm, normal S1, S2, no murmurs, rubs, clicks  or gallops Abdomen - soft, nontender, nondistended, no masses or organomegaly Neurological - alert, oriented, normal speech, no focal findings or movement disorder noted Musculoskeletal - no joint tenderness, deformity or swelling Extremities - peripheral pulses normal, no pedal edema, no clubbing or cyanosis Skin - normal coloration and turgor, no rashes  ED Course  Procedures (including critical care time)  Date: 02/03/2012  Rate: 80  Rhythm: normal sinus rhythm  QRS Axis: normal  Intervals: normal  ST/T Wave abnormalities: normal  Conduction Disutrbances:none  Narrative Interpretation: poor r wave progression  Old EKG Reviewed: unchanged from prior of 8/12    Labs Reviewed  CBC - Abnormal; Notable for the  following:    RBC 5.40 (*)    Hemoglobin 16.3 (*)    HCT 47.2 (*)    Platelets 136 (*)    All other components within normal limits  COMPREHENSIVE METABOLIC PANEL - Abnormal; Notable for the following:    BUN 31 (*)    GFR calc non Af Amer 73 (*)    GFR calc Af Amer 85 (*)    All other components within normal limits  URINALYSIS, ROUTINE W REFLEX MICROSCOPIC  POCT I-STAT TROPONIN I   Dg Chest 2 View  02/03/2012  *RADIOLOGY REPORT*  Clinical Data: Shortness of breath.  Right-sided neck pain.  CHEST - 2 VIEW  Comparison: 07/19/2011,  Findings: The heart size and pulmonary vascularity are normal and the lungs are clear.  No osseous abnormality.  IMPRESSION: Normal chest.  Original Report Authenticated By: Gwynn Burly, M.D.     1. Weakness generalized   2. Shortness of breath       MDM  Patient presenting with complaint of generalized fatigue and weakness as well as some shortness of breath. Her workup in the emergency department today including EKG troponin electrolytes CBC urinalysis and chest x-ray are all reassuring. On examination she is nontoxic and well-hydrated in appearance. She did have a CT angiogram for chest one week ago which did not show any evidence of pulmonary embolism. She has an appointment scheduled to followup as an outpatient with cardiology. I have discussed these results and all the possible causes of her symptoms at length with the patient and her friend at the bedside. Patient is significantly anxious about these symptoms and have encouraged her to continue to seek outpatient followup. She was discharged with strict return precautions and is agreeable with this plan.        Ethelda Chick, MD 02/03/12 [phone]

## 2012-02-21 ENCOUNTER — Encounter: Payer: Self-pay | Admitting: *Deleted

## 2012-02-21 DIAGNOSIS — R071 Chest pain on breathing: Secondary | ICD-10-CM | POA: Insufficient documentation

## 2012-02-21 DIAGNOSIS — R5383 Other fatigue: Secondary | ICD-10-CM

## 2012-02-21 DIAGNOSIS — R0789 Other chest pain: Secondary | ICD-10-CM

## 2012-02-22 ENCOUNTER — Ambulatory Visit (INDEPENDENT_AMBULATORY_CARE_PROVIDER_SITE_OTHER): Payer: BC Managed Care – PPO | Admitting: Cardiology

## 2012-02-22 ENCOUNTER — Encounter: Payer: Self-pay | Admitting: Cardiology

## 2012-02-22 VITALS — BP 139/94 | HR 77 | Ht 67.0 in | Wt 195.0 lb

## 2012-02-22 DIAGNOSIS — F17200 Nicotine dependence, unspecified, uncomplicated: Secondary | ICD-10-CM

## 2012-02-22 DIAGNOSIS — Z720 Tobacco use: Secondary | ICD-10-CM

## 2012-02-22 DIAGNOSIS — R0609 Other forms of dyspnea: Secondary | ICD-10-CM

## 2012-02-22 DIAGNOSIS — R0989 Other specified symptoms and signs involving the circulatory and respiratory systems: Secondary | ICD-10-CM

## 2012-02-22 NOTE — Progress Notes (Signed)
HPI: 66 year old female for evaluation of dyspnea. Note CTA in February 2013 showed no pulmonary embolus. Previous spirometry was normal. Patient states that for the past one month she has had dyspnea. This does not occur with short activities. She only notices this with long periods of activities. There is no orthopnea, PND but there is chronic pedal edema. She denies chest pain. No syncope. She occasionally feels palpitations with lying flat.  Current Outpatient Prescriptions  Medication Sig Dispense Refill  . aspirin EC 81 MG tablet Take 81 mg by mouth as needed.       . hydrochlorothiazide (HYDRODIURIL) 25 MG tablet Take 25 mg by mouth as needed.         Allergies  Allergen Reactions  . Prevacid Other (See Comments)    GI upset  . Tetracyclines & Related Other (See Comments)    Causes severe abdominal pain  . Prednisone Rash    Flushing/redness in face    Past Medical History  Diagnosis Date  . Cataracts, bilateral   . Incontinence   . Chronic headache   . Phlebitis   . Cholelithiasis   . Venous insufficiency (chronic) (peripheral)   . Varicose veins     Past Surgical History  Procedure Date  . Vaginal sling   . Knee arthroscopy 2002    left knee  . Collagen     injections  . Laparoscopic cholecystectomy w/ cholangiography 07/30/11  . Tubal ligation 1976  . Nose surgery     History   Social History  . Marital Status: Divorced    Spouse Name: N/A    Number of Children: 2  . Years of Education: N/A   Occupational History  . textiles [employer]   Social History Main Topics  . Smoking status: Current Everyday Smoker -- 1.0 packs/day for 28 years    Last Attempt to Quit: 01/25/2012  . Smokeless tobacco: Never Used  . Alcohol Use: No  . Drug Use: No  . Sexually Active: Not on file   Other Topics Concern  . Not on file   Social History Narrative   Pt is single.    Family History  Problem Relation Age of Onset  . Heart disease Mother     Unknown type    . COPD Mother   . Diabetes Mother   . Heart disease Father     Unknown type  . COPD Father   . Cancer Sister     lung  . COPD Sister   . Heart disease Sister     Died of MI at age 7  . Diabetes Brother   . Rheum arthritis Mother   . Coronary artery disease    . Hypertension      ROS:  Some problems with arthralgias in her lower extremities but no fevers or chills, productive cough, hemoptysis, dysphasia, odynophagia, melena, hematochezia, dysuria, hematuria, rash, seizure activity, orthopnea, PND, claudication. Remaining systems are negative.  Physical Exam:   Blood pressure 139/94, pulse 77, height 5\' 7"  (1.702 m), weight 195 lb (88.451 kg).  General:  Well developed/well nourished in NAD Skin warm/dry Patient not depressed No peripheral clubbing Back-normal HEENT-normal/normal eyelids Neck supple/normal carotid upstroke bilaterally; no bruits; no JVD; no thyromegaly chest - CTA/ normal expansion CV - RRR/normal S1 and S2; no murmurs, rubs or gallops;  PMI nondisplaced Abdomen -NT/ND, no HSM, no mass, + bowel sounds, no bruit 2+ femoral pulses, no bruits Ext-trace edema, no chords, 2+ DP on the left and 1+  on the right, varicosities noted Neuro-grossly nonfocal  ECG normal sinus rhythm at a rate of 77. Right axis deviation. Inferior T-wave inversion.

## 2012-02-22 NOTE — Patient Instructions (Signed)
Your physician recommends that you schedule a follow-up appointment in: AS NEEDED  Your physician has requested that you have a lexiscan myoview. For further information please visit [email or web address]. Please follow instruction sheet, as given.    

## 2012-02-22 NOTE — Assessment & Plan Note (Signed)
Etiology unclear. Recent pulmonary evaluation negative. Electrocardiogram does show inferior T-wave changes. She has had no chest pain. Plan Myoview to quantify LV function and to exclude ischemia.

## 2012-02-22 NOTE — Assessment & Plan Note (Signed)
Patient counseled on discontinuing. 

## 2012-02-28 ENCOUNTER — Ambulatory Visit (HOSPITAL_COMMUNITY): Payer: BC Managed Care – PPO | Attending: Internal Medicine | Admitting: Radiology

## 2012-02-28 VITALS — BP 128/96 | Ht 67.0 in | Wt 195.0 lb

## 2012-02-28 DIAGNOSIS — R5381 Other malaise: Secondary | ICD-10-CM | POA: Insufficient documentation

## 2012-02-28 DIAGNOSIS — R0609 Other forms of dyspnea: Secondary | ICD-10-CM | POA: Insufficient documentation

## 2012-02-28 DIAGNOSIS — R9431 Abnormal electrocardiogram [ECG] [EKG]: Secondary | ICD-10-CM | POA: Insufficient documentation

## 2012-02-28 DIAGNOSIS — Z8249 Family history of ischemic heart disease and other diseases of the circulatory system: Secondary | ICD-10-CM | POA: Insufficient documentation

## 2012-02-28 DIAGNOSIS — R000 Tachycardia, unspecified: Secondary | ICD-10-CM | POA: Insufficient documentation

## 2012-02-28 DIAGNOSIS — R0789 Other chest pain: Secondary | ICD-10-CM

## 2012-02-28 DIAGNOSIS — R0602 Shortness of breath: Secondary | ICD-10-CM | POA: Insufficient documentation

## 2012-02-28 DIAGNOSIS — R0989 Other specified symptoms and signs involving the circulatory and respiratory systems: Secondary | ICD-10-CM | POA: Insufficient documentation

## 2012-02-28 DIAGNOSIS — R5383 Other fatigue: Secondary | ICD-10-CM

## 2012-02-28 DIAGNOSIS — E663 Overweight: Secondary | ICD-10-CM | POA: Insufficient documentation

## 2012-02-28 DIAGNOSIS — F17200 Nicotine dependence, unspecified, uncomplicated: Secondary | ICD-10-CM | POA: Insufficient documentation

## 2012-02-28 DIAGNOSIS — M542 Cervicalgia: Secondary | ICD-10-CM | POA: Insufficient documentation

## 2012-02-28 DIAGNOSIS — R002 Palpitations: Secondary | ICD-10-CM | POA: Insufficient documentation

## 2012-02-28 MED ORDER — REGADENOSON 0.4 MG/5ML IV SOLN
0.4000 mg | Freq: Once | INTRAVENOUS | Status: AC
  Administered 2012-02-28: 0.4 mg via INTRAVENOUS

## 2012-02-28 MED ORDER — TECHNETIUM TC 99M TETROFOSMIN IV KIT
11.0000 | PACK | Freq: Once | INTRAVENOUS | Status: AC | PRN
  Administered 2012-02-28: 11 via INTRAVENOUS

## 2012-02-28 MED ORDER — TECHNETIUM TC 99M TETROFOSMIN IV KIT
33.0000 | PACK | Freq: Once | INTRAVENOUS | Status: AC | PRN
  Administered 2012-02-28: 33 via INTRAVENOUS

## 2012-02-28 NOTE — Progress Notes (Signed)
[facility] SITE 3 NUCLEAR MED [address] [place] [address] [phone]  Cardiology Nuclear Med Study  Meghan Griffith is a 66 y.o. female     MRN : [id number]     DOB: 1946-08-14  Procedure Date: 02/28/2012  Nuclear Med Background Indication for Stress Test:  Evaluation for Ischemia, 02/03/12 Post Hospital: Dyspnea/weakness and Abnormal EKG History:  ~'05 ION:GEXBMW per patient Cardiac Risk Factors: Family History - CAD, Overweight and Smoker  Symptoms:  DOE, Fatigue, Palpitations, Rapid HR, SOB and Neck Pain/Tightness now, 3/10.   Nuclear Pre-Procedure Caffeine/Decaff Intake:  None NPO After: 8:00pm   Lungs:  clear O2 Sat: 98% on room air. IV 0.9% NS with Angio Cath:  20g  IV Site: R Antecubital  IV Started by:  Stanton Kidney, EMT-P  Chest Size (in):  38 Cup Size: C  Height: 5\' 7"  (1.702 m)  Weight:  195 lb (88.451 kg)  BMI:  Body mass index is 30.54 kg/(m^2). Tech Comments:  NA    Nuclear Med Study 1 or 2 day study: 1 day  Stress Test Type:  Treadmill/Lexiscan  Reading MD: Dietrich Pates, MD  Order Authorizing Provider:  Olga Millers, MD  Resting Radionuclide: Technetium 24m Tetrofosmin  Resting Radionuclide Dose: 10.4 mCi   Stress Radionuclide:  Technetium 105m Tetrofosmin  Stress Radionuclide Dose: 32.8 mCi           Stress Protocol Rest HR: 63 Stress HR: 123  Rest BP: Sitting:128/96; Standing:123/93 Stress BP: 154/85  Exercise Time (min): 2:00 METS: n/a   Predicted Max HR: 157 bpm % Max HR: 78.34 bpm Rate Pressure Product: 41324   Dose of Adenosine (mg):  n/a Dose of Lexiscan: 0.4 mg  Dose of Atropine (mg): n/a Dose of Dobutamine: n/a mcg/kg/min (at max HR)  Stress Test Technologist: Smiley Houseman, CMA-N  Nuclear Technologist:  Domenic Polite, CNMT     Rest Procedure:  Myocardial perfusion imaging was performed at rest 45 minutes following the intravenous administration of Technetium 27m Tetrofosmin.  Rest ECG: NSR   Stress  Procedure:  The patient received IV Lexiscan 0.4 mg over 15-seconds with concurrent low level exercise and then Technetium 37m Tetrofosmin was injected at 30-seconds while the patient continued walking one more minute. There were nonspecific ST-T wave changes with Lexiscan. Quantitative spect images were obtained after a 45-minute delay.  Stress ECG: No significant change from baseline ECG  QPS Raw Data Images:  Soft tissue (breast, diaphragm) surround heart. Stress Images:  Minimal thinning in the anterior wall (mid)  Otherwise normal perfusion. Rest Images:  Normal homogeneous uptake in all areas of the myocardium. Subtraction (SDS):  No evidence of ischemia. Transient Ischemic Dilatation (Normal <1.22):  1.00 Lung/Heart Ratio (Normal <0.45):  0.24  Quantitative Gated Spect Images QGS EDV:  75 ml QGS ESV:  24 ml  Impression Exercise Capacity:  Lexiscan with low level exercise. BP Response:  Normal blood pressure response. Clinical Symptoms:  No significant symptoms noted. ECG Impression:  No significant ST segment change suggestive of ischemia. Comparison with Prior Nuclear Study: No change from previous scan.  Overall Impression:  Minimal soft tissue attenuation (breast) and probable normal perfusion.  No evidence of significant ischemia or scar.  Normal stress nuclear study.  LV Ejection Fraction: 68%.  LV Wall Motion:  NL LV Function; NL Wall Motion  Dietrich Pates

## 2012-06-23 ENCOUNTER — Encounter: Payer: Self-pay | Admitting: *Deleted

## 2013-04-13 ENCOUNTER — Other Ambulatory Visit: Payer: Self-pay | Admitting: Obstetrics and Gynecology

## 2013-04-13 DIAGNOSIS — R928 Other abnormal and inconclusive findings on diagnostic imaging of breast: Secondary | ICD-10-CM

## 2013-05-11 ENCOUNTER — Ambulatory Visit
Admission: RE | Admit: 2013-05-11 | Discharge: 2013-05-11 | Disposition: A | Discharge status: Home / Self Care | Payer: BC Managed Care – PPO | Source: Ambulatory Visit | Attending: Obstetrics and Gynecology | Admitting: Obstetrics and Gynecology

## 2013-05-11 DIAGNOSIS — R928 Other abnormal and inconclusive findings on diagnostic imaging of breast: Secondary | ICD-10-CM

## 2013-05-28 ENCOUNTER — Other Ambulatory Visit: Payer: Self-pay | Admitting: *Deleted

## 2013-05-28 DIAGNOSIS — I83893 Varicose veins of bilateral lower extremities with other complications: Secondary | ICD-10-CM

## 2013-07-26 ENCOUNTER — Encounter (INDEPENDENT_AMBULATORY_CARE_PROVIDER_SITE_OTHER): Payer: BC Managed Care – PPO | Admitting: General Surgery

## 2013-08-02 ENCOUNTER — Encounter (INDEPENDENT_AMBULATORY_CARE_PROVIDER_SITE_OTHER): Payer: BC Managed Care – PPO | Admitting: General Surgery

## 2013-08-07 ENCOUNTER — Encounter: Payer: Self-pay | Admitting: Vascular Surgery

## 2013-08-08 ENCOUNTER — Ambulatory Visit (INDEPENDENT_AMBULATORY_CARE_PROVIDER_SITE_OTHER): Payer: BC Managed Care – PPO | Admitting: Vascular Surgery

## 2013-08-08 ENCOUNTER — Encounter: Payer: Self-pay | Admitting: Vascular Surgery

## 2013-08-08 ENCOUNTER — Encounter (INDEPENDENT_AMBULATORY_CARE_PROVIDER_SITE_OTHER): Payer: BC Managed Care – PPO | Admitting: *Deleted

## 2013-08-08 VITALS — BP 126/74 | HR 58 | Ht 67.0 in | Wt 146.9 lb

## 2013-08-08 DIAGNOSIS — I83893 Varicose veins of bilateral lower extremities with other complications: Secondary | ICD-10-CM

## 2013-08-08 NOTE — Progress Notes (Signed)
[facility]  Patient name: Meghan Griffith MRN: [id number] DOB: 10-Oct-1946 Sex: female  REASON FOR CONSULT: evaluate for chronic venous insufficiency.  HPI: Meghan Griffith is a 67 y.o. female with a long history of bilateral lower extremity varicose veins. Her varicose veins have been gradually increasing in size especially on the right side. She experiences aching pain swelling and itching in her legs. These symptoms are aggravated by standing and sitting and relieved somewhat with elevation. She is unaware of any history of DVT in the past. She has had phlebitis bilaterally in the past. Her mother also had verrucous veins. She's had significant discomfort especially in the right leg and presents for vascular evaluation.  Past Medical History  Diagnosis Date  . Cataracts, bilateral   . Incontinence   . Chronic headache   . Phlebitis   . Cholelithiasis   . Venous insufficiency (chronic) (peripheral)   . Varicose veins    Family History  Problem Relation Age of Onset  . Heart disease Mother     Unknown type  . COPD Mother   . Diabetes Mother   . Rheum arthritis Mother   . Hypertension Mother   . Other Mother     varicose veins  . Heart disease Father     Unknown type  . COPD Father   . Hypertension Father   . Cancer Sister     lung  . COPD Sister   . Heart disease Sister     Died of MI at age 65  . Hypertension Sister   . Diabetes Brother   . Heart disease Brother   . Hypertension Brother   . Coronary artery disease    . Hypertension     SOCIAL HISTORY: History  Substance Use Topics  . Smoking status: Current Every Day Smoker -- 1.00 packs/day for 28 years    Last Attempt to Quit: 01/25/2012  . Smokeless tobacco: Never Used  . Alcohol Use: No   Allergies  Allergen Reactions  . Lansoprazole Other (See Comments)    GI upset  . Tetracyclines & Related Other (See Comments)    Causes severe abdominal pain  . Prednisone Rash     Flushing/redness in face   Current Outpatient Prescriptions  Medication Sig Dispense Refill  . aspirin EC 81 MG tablet Take 81 mg by mouth as needed.       . gabapentin (NEURONTIN) 300 MG capsule Take 1 capsule by mouth 3 (three) times daily.      . hydrochlorothiazide (HYDRODIURIL) 25 MG tablet Take 25 mg by mouth as needed.        No current facility-administered medications for this visit.   REVIEW OF SYSTEMS: Arly.Keller ] denotes positive finding; [  ] denotes negative finding  CARDIOVASCULAR:  [ ]  chest pain   [ ]  chest pressure   [ ]  palpitations   [ ]  orthopnea   [ ]  dyspnea on exertion   [ ]  claudication   [ ]  rest pain   [ ]  DVT   [ ]  phlebitis PULMONARY:   [ ]  productive cough   [ ]  asthma   [ ]  wheezing NEUROLOGIC:   [ ]  weakness  [ ]  paresthesias  [ ]  aphasia  [ ]  amaurosis  [ ]  dizziness HEMATOLOGIC:   [ ]  bleeding problems   [ ]  clotting disorders MUSCULOSKELETAL:  [ ]  joint pain   [ ]  joint swelling Arly.Keller ] leg swelling GASTROINTESTINAL: [ ]   blood in stool  [ ]   hematemesis GENITOURINARY:  [ ]   dysuria  [ ]   hematuria PSYCHIATRIC:  [ ]  history of major depression INTEGUMENTARY:  [ ]  rashes  [ ]  ulcers CONSTITUTIONAL:  [ ]  fever   [ ]  chills  PHYSICAL EXAM: Filed Vitals:   08/08/13 1456  BP: 126/74  Pulse: 58  Height: 5\' 7"  (1.702 m)  Weight: 146 lb 14.4 oz (66.633 kg)  SpO2: 98%   Body mass index is 23 kg/(m^2). GENERAL: The patient is a well-nourished female, in no acute distress. The vital signs are documented above. CARDIOVASCULAR: There is a regular rate and rhythm. Do not detect carotid bruits. She has palpable femoral and pedal pulses bilaterally. She has bilateral lower extremity swelling. PULMONARY: There is good air exchange bilaterally without wheezing or rales. ABDOMEN: Soft and non-tender with normal pitched bowel sounds.  MUSCULOSKELETAL: There are no major deformities or cyanosis. NEUROLOGIC: No focal weakness or paresthesias are detected. SKIN: She has  large varicosities along the anterior aspect of her right leg. She has some large varicosities along the distal lateral aspect of her left thigh which continue across her left knee. She has pledget patient is bilaterally and some hyperpigmentation bilaterally. PSYCHIATRIC: The patient has a normal affect.  DATA:  I have independently interpreted her duplex scan. This shows significant reflux in her right greater saphenous vein and also deep vein reflux on the right. Is no evidence of DVT on the right.  I have also reviewed her records from the referring doctor's office. She was evaluated with painful not on her right foot. This has resolved and I suspect she had some phlebitis related to the varicosities on the dorsum of her right foot.  MEDICAL ISSUES: Patient has significant chronic venous insufficiency with deep vein reflux on the right and also reflux in her right greater saphenous vein. We have discussed the importance of intermittent leg elevation and the proper positioning for this. In addition I have written her a prescription for a thigh high compression stockings with a gradient of 20-30 mm of mercury. If her symptoms persist I think she could be considered for laser ablation of her right greater saphenous vein. I have arranged a follow up visit in 3 months. She knows to call sooner if her symptoms progress.  Jenell Dobransky S [facility]: [phone]

## 2013-08-09 ENCOUNTER — Ambulatory Visit (INDEPENDENT_AMBULATORY_CARE_PROVIDER_SITE_OTHER): Payer: BC Managed Care – PPO | Admitting: General Surgery

## 2013-08-09 ENCOUNTER — Encounter (INDEPENDENT_AMBULATORY_CARE_PROVIDER_SITE_OTHER): Payer: Self-pay | Admitting: General Surgery

## 2013-08-09 VITALS — BP 118/66 | HR 60 | Temp 98.5°F | Resp 14 | Ht 66.0 in | Wt 146.6 lb

## 2013-08-09 DIAGNOSIS — K432 Incisional hernia without obstruction or gangrene: Secondary | ICD-10-CM

## 2013-08-09 NOTE — Progress Notes (Signed)
Patient ID: Meghan Griffith, female   DOB: 29-Jun-1946, 67 y.o.   MRN: [id number]  Chief Complaint  Patient presents with  . New Evaluation    eval UMB hernia    HPI Meghan Griffith is a 67 y.o. female.   HPI 67 year old Caucasian female comes in complaining of a periumbilical hernia. She underwent a laparoscopic cholecystectomy in the summer of 2012 by me. Since her surgery she states that she has lost about 50 pounds which has been planned. She states a few weeks ago She noticed a bulge around her early but in. She noticed that the area with bulge when standing or with exertional activity. She states that it would ache and cause some discomfort in the area when she was lifting or bending over. It has never been hard or very tender. She denies any fever or chills. She denies any nausea or vomiting. She has a bowel movement every 2-3 days which is her baseline. She has not had any additional surgery since her gallbladder surgery. She still smokes about a pack a day. She states that her quit date is October 15. Past Medical History  Diagnosis Date  . Cataracts, bilateral   . Incontinence   . Chronic headache   . Phlebitis   . Cholelithiasis   . Venous insufficiency (chronic) (peripheral)   . Varicose veins   . Arthritis     Past Surgical History  Procedure Laterality Date  . Vaginal sling    . Knee arthroscopy  2002    left knee  . Collagen      injections  . Laparoscopic cholecystectomy w/ cholangiography  07/30/11  . Tubal ligation  1976  . Nose surgery    . Cholecystectomy      Family History  Problem Relation Age of Onset  . Heart disease Mother     Unknown type  . COPD Mother   . Diabetes Mother   . Rheum arthritis Mother   . Hypertension Mother   . Other Mother     varicose veins  . Heart disease Father     Unknown type  . COPD Father   . Hypertension Father   . Cancer Sister     lung  . COPD Sister   . Heart disease Sister     Died of MI at age 7    . Hypertension Sister   . Diabetes Brother   . Heart disease Brother   . Hypertension Brother   . Coronary artery disease    . Hypertension      Social History History  Substance Use Topics  . Smoking status: Current Every Day Smoker -- 1.00 packs/day for 28 years    Last Attempt to Quit: 01/25/2012  . Smokeless tobacco: Never Used  . Alcohol Use: No    Allergies  Allergen Reactions  . Lansoprazole Other (See Comments)    GI upset  . Tetracyclines & Related Other (See Comments)    Causes severe abdominal pain  . Prednisone Rash    Flushing/redness in face    Current Outpatient Prescriptions  Medication Sig Dispense Refill  . aspirin EC 81 MG tablet Take 81 mg by mouth as needed.       . gabapentin (NEURONTIN) 300 MG capsule Take 1 capsule by mouth 3 (three) times daily.       No current facility-administered medications for this visit.    Review of Systems Review of Systems  Constitutional: Negative for fever, activity change, appetite change  and unexpected weight change.       Lost 50 pounds through diet  HENT: Negative for hearing loss, neck pain and sinus pressure.   Eyes: Negative for visual disturbance.  Respiratory: Negative for chest tightness and shortness of breath.   Cardiovascular: Positive for leg swelling. Negative for chest pain and palpitations.       Denies dyspnea on exertion, paroxysmal nocturnal dyspnea and orthopnea  Gastrointestinal: Positive for abdominal pain and constipation. Negative for nausea and diarrhea.  Genitourinary: Negative for dysuria, urgency, difficulty urinating and dyspareunia.  Musculoskeletal:       Joint pain  Skin: Positive for rash.  Neurological: Positive for headaches. Negative for dizziness, tremors, seizures, facial asymmetry, speech difficulty, weakness and numbness.  Psychiatric/Behavioral: Negative for behavioral problems. The patient is not nervous/anxious.     Blood pressure 118/66, pulse 60, temperature 98.5  F (36.9 C), temperature source Temporal, resp. rate 14, height 5\' 6"  (1.676 m), weight 146 lb 9.6 oz (66.497 kg).  Physical Exam Physical Exam  Vitals reviewed. Constitutional: She is oriented to person, place, and time. She appears well-developed and well-nourished. No distress.  HENT:  Head: Normocephalic and atraumatic.  Right Ear: External ear normal.  Left Ear: External ear normal.  Eyes: Conjunctivae are normal. No scleral icterus.  Neck: Normal range of motion. Neck supple. No tracheal deviation present. No thyromegaly present.  Cardiovascular: Normal rate and normal heart sounds.   Pulmonary/Chest: Effort normal and breath sounds normal. No stridor. No respiratory distress. She has no wheezes.  Abdominal: Soft. She exhibits no distension. There is no tenderness. There is no rebound.    Has about a 2cm fascial defect - well defined on pt's ride side of umbilicus. Reducible. Soft. Already has some dimpling of abd wall.   Musculoskeletal: She exhibits no edema and no tenderness.  Lymphadenopathy:    She has no cervical adenopathy.  Neurological: She is alert and oriented to person, place, and time. She exhibits normal muscle tone.  Skin: Skin is warm and dry. No rash noted. She is not diaphoretic. No erythema.  Psychiatric: She has a normal mood and affect. Her behavior is normal. Judgment and thought content normal.    Data Reviewed My OP note Dr Teddy Spike office note 2013  CT PE study - neg 2013 Nuclear Cardiac stress test 02/2012 - negative  Assessment    Periumbilical incisional hernia     Plan    We discussed the etiology of ventral incisional hernias. We discussed the signs and symptoms of incarceration and strangulation. The patient was given educational material. I also drew diagrams.  We discussed nonoperative and operative management. With respect to operative management, we discussed both open repair and laparoscopic repair. We discussed the pros and cons of  each approach. I discussed the typical aftercare with each procedure and how each procedure differs. We discussed that she is at higher risk for recurrence as well as infection if we were to proceed with hernia repair at this time due to her cigarette smoking. The patient states that she is planning to stop smoking October 15. I recommended waiting until November to repair her hernia once she has stopped smoking in order to decrease her risk of recurrence as well as infection  The patient has elected to Proceed with laparoscopic repair of ventral incisional hernia with mesh in November  We discussed the risk and benefits of surgery including but not limited to bleeding, infection, injury to surrounding structures, hernia recurrence, mesh complications, hematoma/seroma  formation, need to convert to an open procedure, blood clot formation, urinary retention, post operative ileus, general anesthesia risk, long-term abdominal pain. We discussed that this procedure can be quite uncomfortable and difficult to recover from based on how the mesh is secured to the abdominal wall. We discussed the importance of avoiding heavy lifting and straining for a period of 6 weeks.    [name]. Andrey Campanile, MD, FACS General, Bariatric, & Minimally Invasive Surgery [facility], [place]        [facility] M 08/09/2013, 3:18 PM

## 2013-08-09 NOTE — Patient Instructions (Signed)
Keep on working on stopping smoking  Laparoscopic Ventral Hernia Repair Laparoscopic ventral hernia repairis a surgery to fix a ventral hernia. Aventral hernia, also called an incisional hernia, is a bulge of body tissue or intestines that pushes through the front part of the abdomen. This can happen if the connective tissue covering the muscles over the abdomen has a weak spot or is torn because of a surgical cut (incision) from a previous surgery. Laparoscopic ventral hernia repair is often done soon after diagnosis to stop the hernia from getting bigger, becoming uncomfortable, or becoming an emergency. This surgery usually takes about 2 hours, but the time can vary greatly. LET YOUR CAREGIVER KNOW ABOUT:  Any allergies you have.  All medicines you are taking, including steroids, vitamins, herbs, eyedrops, and over-the-counter medicines and creams.  Previous problems you or members of your family have had with the use of anesthetics.  Any blood disorders or bleeding problems you have had.  Past surgeries you have had.  Other health problems you have. RISKS AND COMPLICATIONS  Generally, laparoscopic ventral hernia repair is a safe procedure. However, as with any surgical procedure, complications can occur. Possible complications include:  Bleeding.  Trouble passing urine or having a bowel movement after the operation.  Infection.  Pneumonia.  Blood clots.  Pain in the area of the hernia.  A bulge in the area of the hernia that may be caused by a collection of fluid.  Injury to intestines or other structures in the abdomen.  Return of the hernia after surgery. In some cases, the caregiver may need to stop the laparoscopic procedure and do regular, open surgery. This may be necessary for very difficult hernias, when organs are hard to see, or when bleeding problems occur during surgery. BEFORE THE PROCEDURE   You may need to have blood tests, urine tests, a chest X-ray, or  electrocardiography done before the day of the surgery.  Ask your caregiver about changing or stopping your regular medicines.  You may need to wash with a special type of germ-killing soap.  Do not eat or drink anything for at least 6 hours before the surgery.  Make plans to have someone drive you home after the procedure. PROCEDURE   Small monitors will be put on your body. They are used to check your heart, blood pressure, and oxygen level.  An intravenous (IV) access tube will be put into a vein in your hand or arm. Fluids and medicine will flow directly into your body through the IV tube.  You will be given medicine to make you sleep through the procedure (general anesthetic).  Your abdomen will be cleaned with a special soap to kill any germs on your skin.  Once you are asleep, several small incisions will be made in your abdomen.  The large space in your abdomen will be filled with air so that it expands. This gives the caregiver more room and a better view.  A thin, lighted tube with a tiny camera on the end (laparoscope) is put through a small incision in your abdomen. The camera on the laparoscope sends a picture to a TV screen in the operating room. This gives the caregiver a good view inside the abdomen.  Hollow tubes are put through the other small incisions in your abdomen. The tools needed for the procedure are put through these tubes.  The caregiver puts the tissue or intestines that formed the hernia back in place.  A screen-like patch (mesh) is used  to close the hernia. This helps make the area stronger. Stitches, tacks, or staples are used to keep the mesh in place.  Medicine and a bandage (dressing) or skin glue will be put over the incisions. AFTER THE PROCEDURE   You will stay in a recovery area until the anesthetic wears off. Your blood pressure and pulse will be checked often.  You may be able to go home the same day or may need to stay in the hospital for  1 or 2 days after this surgery. Your caregiver will decide when you can go home.  You may feel some pain. You will likely be given medicine for pain.  You will be urged to do breathing exercises that involve taking deep breaths. This helps prevent a lung infection after a surgery.  You may have to wear compression stockings while you are in the hospital. These stockings help keep blood clots from forming in your legs. Document Released: 11/08/2012 Document Reviewed: 09/13/2012 [facility] Patient Information 2014 [place], [place].

## 2013-10-18 ENCOUNTER — Encounter (HOSPITAL_COMMUNITY): Payer: Self-pay | Admitting: Pharmacy Technician

## 2013-10-19 NOTE — Patient Instructions (Signed)
20      Your procedure is scheduled on:  Friday 10/26/2013  Report to [facility] at 0530  AM.  Call this number if you have problems the night before or morning of surgery: [phone]   Remember:             IF YOU USE CPAP,BRING MASK AND TUBING AM OF SURGERY!   Do not eat food or drink liquids AFTER MIDNIGHT!  Take these medicines the morning of surgery with A SIP OF WATER: Neurontin   Do not bring valuables to the hospital. [facility] IS NOT RESPONSIBLE  FOR ANY BELONGINGS OR VALUABLES BROUGHT TO HOSPITAL.  Marland Kitchen  Leave suitcase in the car. After surgery it may be brought to your room.  For patients admitted to the hospital, checkout time is 11:00 AM the day of              Discharge.    DO NOT WEAR JEWELRY , MAKE-UP, LOTIONS,POWDERS,PERFUMES!             WOMEN -DO NOT SHAVE LEGS OR UNDERARMS 12 HRS. BEFORE SURGERY!               MEN MAY SHAVE AS USUAL!             CONTACTS,DENTURES OR BRIDGEWORK, FALSE EYELASHES MAY NOT BE WORN INTO SURGERY!                                           Patients discharged the day of surgery will not be allowed to drive home. If going home the same day of surgery, must have someone stay with you first 24 hrs.at home and arrange for someone to drive you home from the Hospital.                         YOUR DRIVER IS:   Special Instructions:             Please read over the following fact sheets that you were given:             1.  PREPARING FOR SURGERY SHEET              2.INCENTIVE SPIROMETRY                                        [place]p [place].Amire Gossen,RN,BSN     [phone]                FAILURE TO FOLLOW THESE INSTRUCTIONS MAY RESULT IN CANCELLATION OF YOUR SURGERY!               Patient Signature:___________________________

## 2013-10-22 ENCOUNTER — Encounter (HOSPITAL_COMMUNITY): Payer: Self-pay

## 2013-10-22 ENCOUNTER — Encounter (HOSPITAL_COMMUNITY)
Admission: RE | Admit: 2013-10-22 | Discharge: 2013-10-22 | Disposition: A | Discharge status: Home / Self Care | Payer: BC Managed Care – PPO | Source: Ambulatory Visit | Attending: General Surgery | Admitting: General Surgery

## 2013-10-22 DIAGNOSIS — Z01812 Encounter for preprocedural laboratory examination: Secondary | ICD-10-CM | POA: Insufficient documentation

## 2013-10-22 DIAGNOSIS — Z01818 Encounter for other preprocedural examination: Secondary | ICD-10-CM | POA: Insufficient documentation

## 2013-10-22 DIAGNOSIS — Z01810 Encounter for preprocedural cardiovascular examination: Secondary | ICD-10-CM | POA: Insufficient documentation

## 2013-10-22 LAB — BASIC METABOLIC PANEL
BUN: 17 mg/dL (ref 6–23)
Chloride: 103 mEq/L (ref 96–112)
Creatinine, Ser: 0.65 mg/dL (ref 0.50–1.10)
GFR calc Af Amer: >90 mL/min (ref >90)

## 2013-10-22 LAB — CBC WITH DIFFERENTIAL/PLATELET
Basophils Absolute: 0 10*3/uL (ref 0.0–0.1)
Eosinophils Absolute: 0.1 10*3/uL (ref 0.0–0.7)
HCT: 46.1 % — ABNORMAL HIGH (ref 36.0–46.0)
Lymphocytes Relative: 25 % (ref 12–46)
Lymphs Abs: 2 10*3/uL (ref 0.7–4.0)
MCHC: 33.4 g/dL (ref 30.0–36.0)
MCV: 92.8 fL (ref 78.0–100.0)
Monocytes Relative: 7 % (ref 3–12)
Neutro Abs: 5.2 10*3/uL (ref 1.7–7.7)
Neutrophils Relative %: 67 % (ref 43–77)
Platelets: 102 10*3/uL — ABNORMAL LOW (ref 150–400)
RBC: 4.97 MIL/uL (ref 3.87–5.11)
RDW: 13.1 % (ref 11.5–15.5)

## 2013-10-22 NOTE — Progress Notes (Signed)
Labs ok for surgery

## 2013-10-26 ENCOUNTER — Encounter (HOSPITAL_COMMUNITY): Payer: Self-pay | Admitting: *Deleted

## 2013-10-26 ENCOUNTER — Observation Stay (HOSPITAL_COMMUNITY)
Admission: RE | Admit: 2013-10-26 | Discharge: 2013-10-26 | Disposition: A | Discharge status: Home / Self Care | Payer: BC Managed Care – PPO | Source: Ambulatory Visit | Attending: General Surgery | Admitting: General Surgery

## 2013-10-26 ENCOUNTER — Encounter (HOSPITAL_COMMUNITY): Payer: BC Managed Care – PPO | Admitting: Anesthesiology

## 2013-10-26 ENCOUNTER — Ambulatory Visit (HOSPITAL_COMMUNITY): Payer: BC Managed Care – PPO | Admitting: Anesthesiology

## 2013-10-26 ENCOUNTER — Encounter (HOSPITAL_COMMUNITY)
Admission: RE | Disposition: A | Discharge status: Home / Self Care | Payer: Self-pay | Source: Ambulatory Visit | Attending: General Surgery

## 2013-10-26 DIAGNOSIS — K432 Incisional hernia without obstruction or gangrene: Principal | ICD-10-CM

## 2013-10-26 DIAGNOSIS — Z7982 Long term (current) use of aspirin: Secondary | ICD-10-CM | POA: Insufficient documentation

## 2013-10-26 DIAGNOSIS — F17200 Nicotine dependence, unspecified, uncomplicated: Secondary | ICD-10-CM | POA: Insufficient documentation

## 2013-10-26 DIAGNOSIS — Z9089 Acquired absence of other organs: Secondary | ICD-10-CM | POA: Insufficient documentation

## 2013-10-26 DIAGNOSIS — I872 Venous insufficiency (chronic) (peripheral): Secondary | ICD-10-CM | POA: Insufficient documentation

## 2013-10-26 HISTORY — PX: INCISIONAL HERNIA REPAIR: SHX193

## 2013-10-26 HISTORY — PX: INSERTION OF MESH: SHX5868

## 2013-10-26 SURGERY — REPAIR, HERNIA, INCISIONAL, LAPAROSCOPIC
Anesthesia: General | Site: Abdomen | Wound class: Clean

## 2013-10-26 MED ORDER — ACETAMINOPHEN 10 MG/ML IV SOLN
1000.0000 mg | Freq: Once | INTRAVENOUS | Status: DC
  Filled 2013-10-26: qty 100

## 2013-10-26 MED ORDER — DEXAMETHASONE SODIUM PHOSPHATE 10 MG/ML IJ SOLN
INTRAMUSCULAR | Status: DC | PRN
  Administered 2013-10-26: 10 mg via INTRAVENOUS

## 2013-10-26 MED ORDER — OXYCODONE-ACETAMINOPHEN 7.5-325 MG PO TABS: 1.0000 | ORAL_TABLET | ORAL | Status: DC | PRN

## 2013-10-26 MED ORDER — PHENYLEPHRINE HCL 10 MG/ML IJ SOLN
INTRAMUSCULAR | Status: DC | PRN
  Administered 2013-10-26 (×2): 80 ug via INTRAVENOUS

## 2013-10-26 MED ORDER — LACTATED RINGERS IV SOLN
INTRAVENOUS | Status: DC | PRN
  Administered 2013-10-26 (×2): via INTRAVENOUS

## 2013-10-26 MED ORDER — GLYCOPYRROLATE 0.2 MG/ML IJ SOLN
INTRAMUSCULAR | Status: AC
  Filled 2013-10-26: qty 3

## 2013-10-26 MED ORDER — FAMOTIDINE IN NACL 20-0.9 MG/50ML-% IV SOLN
20.0000 mg | Freq: Every day | INTRAVENOUS | Status: DC
  Administered 2013-10-26: 20 mg via INTRAVENOUS
  Filled 2013-10-26 (×2): qty 50

## 2013-10-26 MED ORDER — KETOROLAC TROMETHAMINE 30 MG/ML IJ SOLN
INTRAMUSCULAR | Status: AC
  Filled 2013-10-26: qty 1

## 2013-10-26 MED ORDER — HYDROMORPHONE HCL PF 1 MG/ML IJ SOLN
0.2500 mg | INTRAMUSCULAR | Status: DC | PRN
  Administered 2013-10-26 (×4): 0.5 mg via INTRAVENOUS

## 2013-10-26 MED ORDER — BUPIVACAINE HCL (PF) 0.5 % IJ SOLN
INTRAMUSCULAR | Status: AC
  Filled 2013-10-26: qty 30

## 2013-10-26 MED ORDER — LIDOCAINE HCL (CARDIAC) 20 MG/ML IV SOLN
INTRAVENOUS | Status: DC | PRN
  Administered 2013-10-26: 50 mg via INTRAVENOUS

## 2013-10-26 MED ORDER — CEFAZOLIN SODIUM-DEXTROSE 2-3 GM-% IV SOLR
2.0000 g | INTRAVENOUS | Status: AC
  Administered 2013-10-26: 2 g via INTRAVENOUS

## 2013-10-26 MED ORDER — EPHEDRINE SULFATE 50 MG/ML IJ SOLN
INTRAMUSCULAR | Status: DC | PRN
  Administered 2013-10-26: 10 mg via INTRAVENOUS

## 2013-10-26 MED ORDER — KETOROLAC TROMETHAMINE 15 MG/ML IJ SOLN
15.0000 mg | Freq: Four times a day (QID) | INTRAMUSCULAR | Status: DC
  Administered 2013-10-26 (×2): 15 mg via INTRAVENOUS
  Filled 2013-10-26 (×2): qty 1

## 2013-10-26 MED ORDER — ONDANSETRON HCL 4 MG/2ML IJ SOLN
INTRAMUSCULAR | Status: AC
  Filled 2013-10-26: qty 2

## 2013-10-26 MED ORDER — KETOROLAC TROMETHAMINE 30 MG/ML IJ SOLN
INTRAMUSCULAR | Status: DC | PRN
  Administered 2013-10-26: 30 mg via INTRAVENOUS

## 2013-10-26 MED ORDER — PROMETHAZINE HCL 25 MG/ML IJ SOLN: 6.2500 mg | INTRAMUSCULAR | Status: DC | PRN

## 2013-10-26 MED ORDER — HYDROMORPHONE HCL PF 1 MG/ML IJ SOLN
INTRAMUSCULAR | Status: AC
  Filled 2013-10-26: qty 1

## 2013-10-26 MED ORDER — PHENYLEPHRINE 40 MCG/ML (10ML) SYRINGE FOR IV PUSH (FOR BLOOD PRESSURE SUPPORT)
PREFILLED_SYRINGE | INTRAVENOUS | Status: AC
  Filled 2013-10-26: qty 10

## 2013-10-26 MED ORDER — FENTANYL CITRATE 0.05 MG/ML IJ SOLN
INTRAMUSCULAR | Status: DC | PRN
  Administered 2013-10-26: 50 ug via INTRAVENOUS
  Administered 2013-10-26 (×2): 100 ug via INTRAVENOUS

## 2013-10-26 MED ORDER — 0.9 % SODIUM CHLORIDE (POUR BTL) OPTIME
TOPICAL | Status: DC | PRN
  Administered 2013-10-26: 1000 mL

## 2013-10-26 MED ORDER — MORPHINE SULFATE 2 MG/ML IJ SOLN
1.0000 mg | INTRAMUSCULAR | Status: DC | PRN
  Administered 2013-10-26 (×2): 2 mg via INTRAVENOUS
  Filled 2013-10-26 (×2): qty 1

## 2013-10-26 MED ORDER — FENTANYL CITRATE 0.05 MG/ML IJ SOLN
INTRAMUSCULAR | Status: AC
  Filled 2013-10-26: qty 5

## 2013-10-26 MED ORDER — DEXAMETHASONE SODIUM PHOSPHATE 10 MG/ML IJ SOLN
INTRAMUSCULAR | Status: AC
  Filled 2013-10-26: qty 1

## 2013-10-26 MED ORDER — NEOSTIGMINE METHYLSULFATE 1 MG/ML IJ SOLN
INTRAMUSCULAR | Status: DC | PRN
  Administered 2013-10-26: 4 mg via INTRAVENOUS

## 2013-10-26 MED ORDER — ONDANSETRON HCL 4 MG PO TABS: 4.0000 mg | ORAL_TABLET | Freq: Four times a day (QID) | ORAL | Status: DC | PRN

## 2013-10-26 MED ORDER — ROCURONIUM BROMIDE 100 MG/10ML IV SOLN
INTRAVENOUS | Status: DC | PRN
  Administered 2013-10-26: 35 mg via INTRAVENOUS
  Administered 2013-10-26: 10 mg via INTRAVENOUS

## 2013-10-26 MED ORDER — PROPOFOL 10 MG/ML IV BOLUS
INTRAVENOUS | Status: DC | PRN
  Administered 2013-10-26: 150 mg via INTRAVENOUS

## 2013-10-26 MED ORDER — GLYCOPYRROLATE 0.2 MG/ML IJ SOLN
INTRAMUSCULAR | Status: DC | PRN
  Administered 2013-10-26: 0.6 mg via INTRAVENOUS

## 2013-10-26 MED ORDER — PROPOFOL 10 MG/ML IV BOLUS
INTRAVENOUS | Status: AC
  Filled 2013-10-26: qty 20

## 2013-10-26 MED ORDER — ROCURONIUM BROMIDE 100 MG/10ML IV SOLN
INTRAVENOUS | Status: AC
  Filled 2013-10-26: qty 1

## 2013-10-26 MED ORDER — CEFAZOLIN SODIUM-DEXTROSE 2-3 GM-% IV SOLR
INTRAVENOUS | Status: AC
  Filled 2013-10-26: qty 50

## 2013-10-26 MED ORDER — NEOSTIGMINE METHYLSULFATE 1 MG/ML IJ SOLN
INTRAMUSCULAR | Status: AC
  Filled 2013-10-26: qty 10

## 2013-10-26 MED ORDER — BUPIVACAINE HCL (PF) 0.5 % IJ SOLN
INTRAMUSCULAR | Status: DC | PRN
  Administered 2013-10-26: 30 mL

## 2013-10-26 MED ORDER — FENTANYL CITRATE 0.05 MG/ML IJ SOLN
INTRAMUSCULAR | Status: AC
  Filled 2013-10-26: qty 2

## 2013-10-26 MED ORDER — SUCCINYLCHOLINE CHLORIDE 20 MG/ML IJ SOLN
INTRAMUSCULAR | Status: DC | PRN
  Administered 2013-10-26: 35 mg via INTRAVENOUS
  Administered 2013-10-26: 100 mg via INTRAVENOUS

## 2013-10-26 MED ORDER — EPHEDRINE SULFATE 50 MG/ML IJ SOLN
INTRAMUSCULAR | Status: AC
  Filled 2013-10-26: qty 1

## 2013-10-26 MED ORDER — MIDAZOLAM HCL 5 MG/5ML IJ SOLN
INTRAMUSCULAR | Status: DC | PRN
  Administered 2013-10-26: 2 mg via INTRAVENOUS

## 2013-10-26 MED ORDER — ENOXAPARIN SODIUM 40 MG/0.4ML ~~LOC~~ SOLN
40.0000 mg | SUBCUTANEOUS | Status: DC
  Filled 2013-10-26: qty 0.4

## 2013-10-26 MED ORDER — FENTANYL CITRATE 0.05 MG/ML IJ SOLN
25.0000 ug | INTRAMUSCULAR | Status: DC | PRN
  Administered 2013-10-26 (×2): 50 ug via INTRAVENOUS

## 2013-10-26 MED ORDER — MIDAZOLAM HCL 2 MG/2ML IJ SOLN
INTRAMUSCULAR | Status: AC
  Filled 2013-10-26: qty 2

## 2013-10-26 MED ORDER — ONDANSETRON HCL 4 MG/2ML IJ SOLN
4.0000 mg | Freq: Four times a day (QID) | INTRAMUSCULAR | Status: DC | PRN
  Administered 2013-10-26: 4 mg via INTRAVENOUS
  Filled 2013-10-26: qty 2

## 2013-10-26 MED ORDER — KCL IN DEXTROSE-NACL 20-5-0.45 MEQ/L-%-% IV SOLN
INTRAVENOUS | Status: DC
  Filled 2013-10-26 (×2): qty 1000

## 2013-10-26 MED ORDER — OXYCODONE-ACETAMINOPHEN 5-325 MG PO TABS
1.0000 | ORAL_TABLET | ORAL | Status: DC | PRN
  Administered 2013-10-26: 2 via ORAL
  Filled 2013-10-26: qty 2

## 2013-10-26 SURGICAL SUPPLY — 48 items
BANDAGE ADHESIVE 1X3 (GAUZE/BANDAGES/DRESSINGS) IMPLANT
BENZOIN TINCTURE PRP APPL 2/3 (GAUZE/BANDAGES/DRESSINGS) IMPLANT
BINDER ABD UNIV 12 45-62 (WOUND CARE) ×1 IMPLANT
BINDER ABDOMINAL 46IN 62IN (WOUND CARE) ×2
CANISTER SUCTION 2500CC (MISCELLANEOUS) ×2 IMPLANT
CHLORAPREP W/TINT 26ML (MISCELLANEOUS) ×2 IMPLANT
DECANTER SPIKE VIAL GLASS SM (MISCELLANEOUS) ×2 IMPLANT
DERMABOND ADVANCED (GAUZE/BANDAGES/DRESSINGS) ×1
DERMABOND ADVANCED .7 DNX12 (GAUZE/BANDAGES/DRESSINGS) ×1 IMPLANT
DEVICE SECURE STRAP 25 ABSORB (INSTRUMENTS) ×4 IMPLANT
DEVICE SUTURE ENDOST 10MM (ENDOMECHANICALS) ×2 IMPLANT
DEVICE TROCAR PUNCTURE CLOSURE (ENDOMECHANICALS) ×4 IMPLANT
DISSECTOR BLUNT TIP ENDO 5MM (MISCELLANEOUS) IMPLANT
DRAPE LAPAROSCOPIC ABDOMINAL (DRAPES) ×2 IMPLANT
DRAPE UTILITY XL STRL (DRAPES) ×2 IMPLANT
DRSG TEGADERM 2-3/8X2-3/4 SM (GAUZE/BANDAGES/DRESSINGS) IMPLANT
ELECT REM PT RETURN 9FT ADLT (ELECTROSURGICAL) ×2
ELECTRODE REM PT RTRN 9FT ADLT (ELECTROSURGICAL) ×1 IMPLANT
GLOVE BIOGEL M STRL SZ7.5 (GLOVE) IMPLANT
GLOVE INDICATOR 8.0 STRL GRN (GLOVE) ×2 IMPLANT
GLOVE SURG SIGNA 7.5 PF LTX (GLOVE) ×2 IMPLANT
GOWN PREVENTION PLUS LG XLONG (DISPOSABLE) ×2 IMPLANT
GOWN STRL REIN XL XLG (GOWN DISPOSABLE) ×4 IMPLANT
KIT BASIN OR (CUSTOM PROCEDURE TRAY) ×2 IMPLANT
MARKER SKIN DUAL TIP RULER LAB (MISCELLANEOUS) ×2 IMPLANT
MESH VENTRALIGHT ST 6IN CRC (Mesh General) ×2 IMPLANT
NEEDLE INSUFFLATION 14GA 120MM (NEEDLE) IMPLANT
NEEDLE SPNL 22GX3.5 QUINCKE BK (NEEDLE) ×2 IMPLANT
NS IRRIG 1000ML POUR BTL (IV SOLUTION) ×2 IMPLANT
PAIN PUMP ON-Q 400MLX5ML 5IN (MISCELLANEOUS) IMPLANT
PENCIL BUTTON HOLSTER BLD 10FT (ELECTRODE) IMPLANT
SCALPEL HARMONIC ACE (MISCELLANEOUS) IMPLANT
SCISSORS LAP 5X35 DISP (ENDOMECHANICALS) IMPLANT
SET IRRIG TUBING LAPAROSCOPIC (IRRIGATION / IRRIGATOR) IMPLANT
SOLUTION ANTI FOG 6CC (MISCELLANEOUS) ×2 IMPLANT
STRIP CLOSURE SKIN 1/2X4 (GAUZE/BANDAGES/DRESSINGS) IMPLANT
SUT MNCRL AB 4-0 PS2 18 (SUTURE) IMPLANT
SUT NOVA 0 T19/GS 22DT (SUTURE) IMPLANT
SUT SURGIDAC NAB ES-9 0 48 120 (SUTURE) ×10 IMPLANT
TACKER 5MM HERNIA 3.5CML NAB (ENDOMECHANICALS) IMPLANT
TOWEL OR 17X26 10 PK STRL BLUE (TOWEL DISPOSABLE) ×2 IMPLANT
TRAY FOLEY CATH 14FRSI W/METER (CATHETERS) ×2 IMPLANT
TRAY LAP CHOLE (CUSTOM PROCEDURE TRAY) ×2 IMPLANT
TROCAR BLADELESS OPT 5 75 (ENDOMECHANICALS) ×2 IMPLANT
TROCAR XCEL BLUNT TIP 100MML (ENDOMECHANICALS) IMPLANT
TROCAR XCEL NON-BLD 11X100MML (ENDOMECHANICALS) ×2 IMPLANT
TROCAR XCEL UNIV SLVE 11M 100M (ENDOMECHANICALS) ×2 IMPLANT
TUBING INSUFFLATION 10FT LAP (TUBING) ×2 IMPLANT

## 2013-10-26 NOTE — Anesthesia Preprocedure Evaluation (Signed)
Anesthesia Evaluation  Patient identified by MRN, date of birth, ID band Patient awake    Reviewed: Allergy & Precautions, H&P , NPO status , Patient's Chart, lab work & pertinent test results  Airway Mallampati: II TM Distance: >3 FB Neck ROM: Full    Dental no notable dental hx.    Pulmonary former smoker,  breath sounds clear to auscultation  Pulmonary exam normal       Cardiovascular + DOE Rhythm:Regular Rate:Normal     Neuro/Psych negative neurological ROS  negative psych ROS   GI/Hepatic negative GI ROS, Neg liver ROS,   Endo/Other  negative endocrine ROS  Renal/GU negative Renal ROS  negative genitourinary   Musculoskeletal negative musculoskeletal ROS (+)   Abdominal   Peds negative pediatric ROS (+)  Hematology negative hematology ROS (+)   Anesthesia Other Findings   Reproductive/Obstetrics negative OB ROS                           Anesthesia Physical Anesthesia Plan  ASA: II  Anesthesia Plan: General   Post-op Pain Management:    Induction: Intravenous  Airway Management Planned: Oral ETT  Additional Equipment:   Intra-op Plan:   Post-operative Plan: Extubation in OR  Informed Consent: I have reviewed the patients History and Physical, chart, labs and discussed the procedure including the risks, benefits and alternatives for the proposed anesthesia with the patient or authorized representative who has indicated his/her understanding and acceptance.   Dental advisory given  Plan Discussed with: CRNA and Surgeon  Anesthesia Plan Comments:         Anesthesia Quick Evaluation

## 2013-10-26 NOTE — Anesthesia Postprocedure Evaluation (Signed)
  Anesthesia Post-op Note  Patient: Meghan Griffith  Procedure(s) Performed: Procedure(s) (LRB): LAPAROSCOPIC INCISIONAL HERNIA (N/A) INSERTION OF MESH (N/A)  Patient Location: PACU  Anesthesia Type: General  Level of Consciousness: awake and alert   Airway and Oxygen Therapy: Patient Spontanous Breathing  Post-op Pain: mild  Post-op Assessment: Post-op Vital signs reviewed, Patient's Cardiovascular Status Stable, Respiratory Function Stable, Patent Airway and No signs of Nausea or vomiting  Last Vitals:  Filed Vitals:   10/26/13 1000  BP: 131/73  Pulse: 49  Temp:   Resp: 12    Post-op Vital Signs: stable   Complications: No apparent anesthesia complications

## 2013-10-26 NOTE — Transfer of Care (Signed)
Immediate Anesthesia Transfer of Care Note  Patient: Meghan Griffith  Procedure(s) Performed: Procedure(s): LAPAROSCOPIC INCISIONAL HERNIA (N/A) INSERTION OF MESH (N/A)  Patient Location: PACU  Anesthesia Type:General  Level of Consciousness: awake and alert   Airway & Oxygen Therapy: Patient Spontanous Breathing and Patient connected to face mask oxygen  Post-op Assessment: Report given to PACU RN and Post -op Vital signs reviewed and stable  Post vital signs: Reviewed and stable  Complications: No apparent anesthesia complications

## 2013-10-26 NOTE — Discharge Summary (Signed)
Physician Discharge Summary  Patient ID: Meghan Griffith MRN: [id number] DOB/AGE: Aug 04, 1946 67 y.o.  Admit date: 10/26/2013 Discharge date: 10/26/2013  Admission Diagnoses: Ventral incisional hernia Arthritis  Discharge Diagnoses:  Same  Discharged Condition: fair  Hospital Course: The patient came into the hospital on November 21 for a planned laparoscopic repair of a ventral incisional hernia repair. Preoperatively the patient indicated that she was very motivated to go home the same day of surgery. She underwent laparoscopic repair of a ventral incisional hernia with mesh underlay. Postoperatively she was taken to the recovery room And subsequentlyto the floor. She did well. She was ambulating without difficulty. She was tolerating a diet. Her pain was controlled with oral medication. Her vital signs are stable. She had voided. She desired discharge.I discussed discharge instructions with the family immediately after surgery  Consults: None  Significant Diagnostic Studies: none  Treatments: IV hydration, analgesia: Morphine and Toradol and Percocet and surgery: Laparoscopic repair of ventral incisional hernia with mesh  Disposition: 01-Home or Self Care  Discharge Orders   Future Appointments Provider Department Dept Phone   11/20/2013 2:15 PM Larina Earthly, MD [facility] [phone]   Future Orders Complete By Expires   Discharge instructions  As directed    Comments:     See [facility] discharge instructions   Increase activity slowly  As directed        Medication List         ADVIL 200 MG Caps  Generic drug:  Ibuprofen  Take 200 mg by mouth daily as needed (headache).     aspirin EC 81 MG tablet  Take 81 mg by mouth as needed.     gabapentin 300 MG capsule  Commonly known as:  NEURONTIN  Take 1 capsule by mouth 3 (three) times daily.     GAS-X EXTRA STRENGTH PO  Take 1 tablet by mouth daily as needed (gas).     loperamide 2  MG capsule  Commonly known as:  IMODIUM  Take 2 mg by mouth as needed for diarrhea or loose stools.     multivitamin with minerals tablet  Take 1 tablet by mouth daily.     oxyCODONE-acetaminophen 7.5-325 MG per tablet  Commonly known as:  PERCOCET  Take 1-2 tablets by mouth every 4 (four) hours as needed for pain.           Follow-up Information   Follow up with Atilano Ina, MD. Schedule an appointment as soon as possible for a visit in 3 weeks.   Specialty:  General Surgery   Contact information:   [address] [place] [address] [phone]       Signed: Atilano Ina 10/26/2013, 10:03 PM

## 2013-10-26 NOTE — H&P (Signed)
Meghan Griffith is an 67 y.o. female.   Chief Complaint: here for surgery HPI: 67 year old Caucasian female comes in complaining of a periumbilical hernia. She underwent a laparoscopic cholecystectomy in the summer of 2012 by me. Since her surgery she states that she has lost about 50 pounds which has been planned. She states a few weeks ago She noticed a bulge around her early but in. She noticed that the area with bulge when standing or with exertional activity. She states that it would ache and cause some discomfort in the area when she was lifting or bending over. It has never been hard or very tender. She denies any fever or chills. She denies any nausea or vomiting. She has a bowel movement every 2-3 days which is her baseline. She has not had any additional surgery since her gallbladder surgery. She still smokes about a pack a day. She states that her quit date is October 15.  Denies any changes since last seen in office in September except stopped smoking  Past Medical History  Diagnosis Date  . Cataracts, bilateral   . Incontinence   . Chronic headache   . Phlebitis   . Cholelithiasis   . Venous insufficiency (chronic) (peripheral)   . Varicose veins   . Arthritis     Past Surgical History  Procedure Laterality Date  . Vaginal sling    . Knee arthroscopy  2002    left knee  . Collagen      injections  . Laparoscopic cholecystectomy w/ cholangiography  07/30/11  . Tubal ligation  1976  . Nose surgery    . Cholecystectomy      Family History  Problem Relation Age of Onset  . Heart disease Mother     Unknown type  . COPD Mother   . Diabetes Mother   . Rheum arthritis Mother   . Hypertension Mother   . Other Mother     varicose veins  . Heart disease Father     Unknown type  . COPD Father   . Hypertension Father   . Cancer Sister     lung  . COPD Sister   . Heart disease Sister     Died of MI at age 21  . Hypertension Sister   . Diabetes Brother   . Heart  disease Brother   . Hypertension Brother   . Coronary artery disease    . Hypertension     Social History:  reports that she quit smoking about 5 weeks ago. She has never used smokeless tobacco. She reports that she does not drink alcohol or use illicit drugs.  Allergies:  Allergies  Allergen Reactions  . Lansoprazole Other (See Comments)    GI upset  . Tetracyclines & Related Other (See Comments)    Causes severe abdominal pain  . Prednisone Rash    Flushing/redness in face    Medications Prior to Admission  Medication Sig Dispense Refill  . gabapentin (NEURONTIN) 300 MG capsule Take 1 capsule by mouth 3 (three) times daily.      . Ibuprofen (ADVIL) 200 MG CAPS Take 200 mg by mouth daily as needed (headache).      . loperamide (IMODIUM) 2 MG capsule Take 2 mg by mouth as needed for diarrhea or loose stools.      . Multiple Vitamins-Minerals (MULTIVITAMIN WITH MINERALS) tablet Take 1 tablet by mouth daily.      . Simethicone (GAS-X EXTRA STRENGTH PO) Take 1 tablet by mouth daily  as needed (gas).      Marland Kitchen aspirin EC 81 MG tablet Take 81 mg by mouth as needed.        No results found for this or any previous visit (from the past 48 hour(s)). No results found.  Review of Systems  Constitutional: Negative for weight loss.  HENT: Negative for nosebleeds.   Eyes: Negative for blurred vision.  Respiratory: Negative for shortness of breath.   Cardiovascular: Negative for chest pain, palpitations, orthopnea and PND.       Denies DOE  Gastrointestinal: Positive for abdominal pain and constipation.  Genitourinary: Negative for dysuria and hematuria.  Musculoskeletal: Negative.   Skin: Negative for itching and rash.  Neurological: Negative for dizziness, focal weakness, seizures, loss of consciousness and headaches.       Denies TIAs, amaurosis fugax  Endo/Heme/Allergies: Does not bruise/bleed easily.  Psychiatric/Behavioral: The patient is not nervous/anxious.     Blood pressure  132/75, pulse 54, resp. rate 18, SpO2 100.00%. Physical Exam  Vitals reviewed. Constitutional: She is oriented to person, place, and time. She appears well-developed and well-nourished. No distress.  HENT:  Head: Normocephalic and atraumatic.  Right Ear: External ear normal.  Left Ear: External ear normal.  Eyes: Conjunctivae are normal. No scleral icterus.  Neck: Normal range of motion. No tracheal deviation present.  Cardiovascular: Normal rate.   Respiratory: Effort normal. No stridor. No respiratory distress.  GI: Soft. She exhibits no distension. There is no tenderness. There is no rebound. A hernia is present. Hernia confirmed positive in the ventral area.    Musculoskeletal: She exhibits no edema.  Neurological: She is alert and oriented to person, place, and time. She exhibits normal muscle tone.  Skin: Skin is warm and dry. No rash noted. She is not diaphoretic. No erythema. No pallor.  Psychiatric: She has a normal mood and affect. Her behavior is normal. Judgment and thought content normal.     Assessment/Plan Ventral incisional hernia  To OR for repair. All questions asked and answered  Meghan Griffith. Andrey Campanile, MD, FACS General, Bariatric, & Minimally Invasive Surgery [facility], [place]   [facility] M 10/26/2013, 7:29 AM

## 2013-10-26 NOTE — Brief Op Note (Signed)
10/26/2013  9:18 AM  PATIENT:  Meghan Griffith  67 y.o. female  PRE-OPERATIVE DIAGNOSIS:  incisional ventral hernia   POST-OPERATIVE DIAGNOSIS:  incisional ventral hernia   PROCEDURE:  Procedure(s): LAPAROSCOPIC VENTRAL INCISIONAL HERNIA REPAIR WITH MESH(N/A) INSERTION OF MESH (N/A)  SURGEON:  Surgeon(s) and Role:    * Atilano Ina, MD - Primary  PHYSICIAN ASSISTANT: none  ASSISTANTS: none   ANESTHESIA:   general  Type of repair - suture repair with mesh underlay  (choices - primary suture, mesh, or component)  Name of mesh - Bard VentralightST  Size of mesh - round 15.2cm  Mesh overlap - 5 cm  Placement of mesh - beneath fascia and into peritoneal cavity  (choices - beneath fascia and into peritoneal cavity, beneath fascia but external to peritoneal cavity, between the muscle and fascia, above or external to fascia)   EBL:  Total I/O In: 1000 [I.V.:1000] Out: -   BLOOD ADMINISTERED:none  DRAINS: none   LOCAL MEDICATIONS USED:  MARCAINE     SPECIMEN:  No Specimen  DISPOSITION OF SPECIMEN:  N/A  COUNTS:  YES  TOURNIQUET:  * No tourniquets in log *  DICTATION: .Other Dictation: Dictation Number [id number]  PLAN OF CARE: Admit for overnight observation  PATIENT DISPOSITION:  PACU - hemodynamically stable.   Delay start of Pharmacological VTE agent (>24hrs) due to surgical blood loss or risk of bleeding: no  [name]. Andrey Campanile, MD, FACS General, Bariatric, & Minimally Invasive Surgery [facility], [place]

## 2013-10-27 NOTE — Op Note (Signed)
NAMEMarland Kitchen  ZAMORAH, AILES NO.:  [id number]  MEDICAL RECORD NO.:  [id number]  LOCATION:  1526                         FACILITY:  [facility]  PHYSICIAN:  [name]. Andrey Campanile, MD, FACSDATE OF BIRTH:  [birth year]  DATE OF PROCEDURE:  10/26/2013 DATE OF DISCHARGE:  10/26/2013                              OPERATIVE REPORT   PREOPERATIVE DIAGNOSIS:  Incisional ventral hernia.  POSTOPERATIVE DIAGNOSIS:  Incisional ventral hernia.  PROCEDURE:  Laparoscopic ventral incisional hernia repair with mesh.  SURGEON:  [name]. Andrey Campanile, MD, FACS  ASSISTANT SURGEON:  None.  ANESTHESIA:  General plus 0.25% Marcaine with epinephrine.  ESTIMATED BLOOD LOSS:  Minimal.  SPECIMEN:  None.  FINDINGS:  The patient had a round ventral incisional hernia just to the right of her umbilicus.  The gross measurements of the actual defect were around 4 cm x 4 cm.  A 5 cm mesh overlap was added to those measurements to give [place] a total mesh size needed of around 12.5 x 12.5 cm.  Therefore, I chose a 15 cm round Bard Ventralight ST mesh which gave more than a 5 cm overlap.  INDICATIONS FOR PROCEDURE:  The patient is a very pleasant 67 year old female, who underwent a laparoscopic cholecystectomy a while ago by me. At that time, she was morbidly obese.  Since that surgery, she has lost over 60 pounds.  She developed a bulge just to the right of her umbilicus.  It has been bothering her and she desired intervention.  We discussed the risks and benefits of a laparoscopic versus open repair, now the 2 deferred.  We discussed the risks and benefits of a laparoscopic approach including but not limited to, bleeding, infection, injury to surrounding structures, need to convert to open procedure, blood clot formation, seroma, mesh complications, ileus, chronic abdominal wall pain as well as cosmetic outcomes.  We did discuss she was at higher risk for infection as well as recurrence if she had ongoing tobacco  usage.  She elected to proceed to surgery.  DESCRIPTION OF PROCEDURE:  After obtaining informed consent, she was taken to the operating room #6 at [facility], placed supine on the operating table.  General endotracheal anesthesia was established. A Foley catheter was placed.  Her arms were placed at her sides with the appropriate padding.  Her abdomen was prepped and draped in usual standard surgical fashion with ChloraPrep.  A surgical time-out was performed.  She received IV antibiotics prior to skin incision.  A small incision was made in the left upper abdomen just below the left subcostal margin with a #11 blade.  Then using a 0-degree 5-mm laparoscope through a 5-mm trocar, I advanced it through all layers of the abdominal wall and entered the abdominal cavity.  Pneumoperitoneum was smoothly established up to a patient pressure of 15 mmHg.  There was no evidence of injury to surrounding structures.  The abdominal cavity was surveilled.  There were no omental adhesions.  The defect was just to the right of her umbilicus.  There was nothing within the defect.  An 11 mm trocar was placed in the lateral left mid abdomen under direct visualization in the anterior axillary line.  I then went about  taking down the falciform ligament with Endo Shears with electrocautery in order to free the space above the defect.  Then using a spinal needle, I marked the 4 edges of the fascial defect superior and inferior and medial and laterally.  I then desufflated the abdomen.  I measured the distance between the markings which was approximately a little over 4 cm x 4 cm.  Then, I did a 5 cm overlap in a circumferential manner, which gave [place] a total mesh requirement of at least 12 cm.  I chose round piece of Bard Ventralight ST mesh.  I placed 4-0 Novafil sutures through the mesh to serve as my transfascial sutures equidistance from one another. Prior to inserting the mesh, I then  reapproximated the fascia transversely by placing 4 interrupted 0 Ethibond sutures using the Endo Stitch in the external knot pusher to reapproximate the fascial edges. At this point, the fascia was well approximated.  I did partially deflate the abdomen when tying down the sutures in order to get relatively good approximation.  At this point, the mesh was moistened and placed in the abdomen.  It was uncurled.  Then using an Endo Close device, I brought out each tail of the transfascial sutures through the premarked skin incisions in the upper midline, lower midline, right lateral abdominal wall, left lateral wall.  There was no redundancy in the mesh.  Unfortunately, one of the transfascial sutures in the left lateral abdominal wall had been shredded by the Endo Close device and so it was no longer usable.  I went ahead and tied down the 3 other transfascial sutures.  I then placed tacks using the Ethicon SecureStrap tacker all the way in a circumferential manner around the entire mesh at 1 cm intervals.  There was no gap of the mesh.  The mesh was completely flushed against the abdominal wall.  At this point, I obtained another 0 Novafil suture, I removed the needle and then using the suture passer, I passed the Novafil transvaginally and brought it up through the mesh to serve as the left lateral abdominal wall transfascial stitch.  It was tied down and buried underneath the skin like the other transfascial stitches.  The mesh was reinspected.  It was flushed against the abdominal wall.  There were no gaps between the mesh and the abdominal wall.  Pneumoperitoneum was released. An 11-mm trocar was removed as well as the 5 mm trocar.  The fascia was closed at the 11 mm trocar site with interrupted 0 Vicryl.  The skin was closed with a running 4-0 Monocryl for both trocar sites.  Local had been infiltrated at the 4 transfascial suture sites.  Dermabond was applied to the transfascial  suture sites as well as to the trocar sites. An abdominal binder was placed.  A Foley catheter was removed.  All needle, instrument, and sponge counts were correct x2.  There were no immediate complications.  The patient tolerated the procedure well.     [name]. Andrey Campanile, MD, FACS     EMW/MEDQ  D:  10/26/2013  T:  10/27/2013  Job:  [id number]

## 2013-10-29 ENCOUNTER — Encounter (HOSPITAL_COMMUNITY): Payer: Self-pay | Admitting: General Surgery

## 2013-11-15 ENCOUNTER — Encounter (INDEPENDENT_AMBULATORY_CARE_PROVIDER_SITE_OTHER): Payer: Self-pay | Admitting: General Surgery

## 2013-11-15 ENCOUNTER — Ambulatory Visit (INDEPENDENT_AMBULATORY_CARE_PROVIDER_SITE_OTHER): Payer: BC Managed Care – PPO | Admitting: General Surgery

## 2013-11-15 VITALS — BP 130/86 | HR 84 | Temp 98.7°F | Resp 14 | Ht 66.0 in | Wt 139.4 lb

## 2013-11-15 DIAGNOSIS — Z09 Encounter for follow-up examination after completed treatment for conditions other than malignant neoplasm: Secondary | ICD-10-CM

## 2013-11-15 MED ORDER — HYDROCODONE-ACETAMINOPHEN 5-300 MG PO TABS: 1.0000 | ORAL_TABLET | ORAL | Status: DC | PRN

## 2013-11-15 NOTE — Patient Instructions (Signed)
Can slowly resume full activities after 12/21 Wear binder as needed

## 2013-11-15 NOTE — Progress Notes (Signed)
Subjective:     Patient ID: Meghan Griffith, female   DOB: 03/18/1946, 67 y.o.   MRN: [id number]  HPI 67 year old Caucasian female comes in for followup after undergoing laparoscopic repair of a ventral incisional hernia repair with mesh on November 21. She went home the same day. She states that she had a fair amount of pain after surgery however she could not take the Percocets because it made her very sick on her stomach. She has taken hydrocodone in the past without side effects. She still has some discomfort in her right lower abdominal wall. It is getting better but is still present. She denies any fever, chills, nausea, vomiting, diarrhea or constipation. She states that work that she sometimes has to push or pull structures that we close to 1000 pounds  Review of Systems     Objective:   Physical Exam BP 130/86  Pulse 84  Temp(Src) 98.7 F (37.1 C) (Temporal)  Resp 14  Ht 5\' 6"  (1.676 m)  Wt 139 lb 6.4 oz (63.231 kg)  BMI 22.51 kg/m2 Lungs are clear auscultation No apparent distress Abdomen-soft, nontender, nondistended. Well-healed trocar sites. No evidence of seroma. No cellulitis. No evidence of hernia recurrence    Assessment:     Status post laparoscopic repair of ventral incisional hernia with mesh     Plan:     Overall I think she is doing well. I reminded her she should wear her abdominal binder for another week and a half. I told her she could resume full activities in about 2-1/2 weeks. Given the amount of weight that she has to push and pull at work I think the safest course of action is to keep her out of work for a total of 8 weeks. Followup with me in 8 weeks. I gave her prescription for hydrocodone.  Meghan Griffith. Andrey Campanile, MD, FACS General, Bariatric, & Minimally Invasive Surgery [facility], [place]

## 2013-11-19 ENCOUNTER — Encounter: Payer: Self-pay | Admitting: Vascular Surgery

## 2013-11-20 ENCOUNTER — Encounter: Payer: Self-pay | Admitting: Vascular Surgery

## 2013-11-20 ENCOUNTER — Ambulatory Visit (INDEPENDENT_AMBULATORY_CARE_PROVIDER_SITE_OTHER): Payer: BC Managed Care – PPO | Admitting: Vascular Surgery

## 2013-11-20 VITALS — BP 115/72 | HR 78 | Resp 18 | Ht 65.0 in | Wt 140.5 lb

## 2013-11-20 DIAGNOSIS — I83893 Varicose veins of bilateral lower extremities with other complications: Secondary | ICD-10-CM

## 2013-11-20 NOTE — Progress Notes (Signed)
Problems with Activities of Daily Living Secondary to Leg Pain  1. 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  This is very difficult due to leg pain.  2. Meghan Griffith states Vanetta Shawl is very difficult due to leg pain.     Failure of  Conservative Therapy:  1. Worn 20-30 mm Hg thigh high compression hose >3 months with no relief of symptoms.  2. Frequently elevates legs-no relief of symptoms  3. Taken Ibuprofen 600 Mg TID with no relief of symptoms.  The patient continues to have discomfort despite maximal medical therapy with anti-inflammatories compression and elevation. I reimaged saphenous vein with a SonoSite ultrasound. She does have an enlarged right great saphenous vein extending into the tributary varicosities. I have recommended right great saphenous vein laser ablation and stab phlebectomy of multiple tributary varicosities. Explain that this is an outpatient procedure under local anesthesia. I did explain the various low risk for DVT associated with the procedure. She understands and wishes to proceed as soon as possible

## 2013-11-21 ENCOUNTER — Encounter: Payer: Self-pay | Admitting: Vascular Surgery

## 2013-12-25 ENCOUNTER — Encounter (INDEPENDENT_AMBULATORY_CARE_PROVIDER_SITE_OTHER): Payer: Self-pay | Admitting: General Surgery

## 2013-12-26 ENCOUNTER — Telehealth (INDEPENDENT_AMBULATORY_CARE_PROVIDER_SITE_OTHER): Payer: Self-pay | Admitting: General Surgery

## 2013-12-26 NOTE — Telephone Encounter (Signed)
Pt called with question about new twinges at surgical site; she has just returned to work.  Pt reassured and explained that the intermittent, sharp, burning pain is consistent with scar tissue formation.  Discussion on what to expect with this and okay to apply ice pack and can use NSAIDs for discomfort.  She seemed much more at ease with the information.

## 2014-01-02 ENCOUNTER — Other Ambulatory Visit: Payer: Self-pay | Admitting: *Deleted

## 2014-01-02 DIAGNOSIS — I83893 Varicose veins of bilateral lower extremities with other complications: Secondary | ICD-10-CM

## 2014-02-06 ENCOUNTER — Ambulatory Visit (INDEPENDENT_AMBULATORY_CARE_PROVIDER_SITE_OTHER): Payer: BC Managed Care – PPO | Admitting: General Surgery

## 2014-02-06 ENCOUNTER — Encounter (INDEPENDENT_AMBULATORY_CARE_PROVIDER_SITE_OTHER): Payer: Self-pay | Admitting: General Surgery

## 2014-02-06 VITALS — BP 118/84 | HR 68 | Temp 99.1°F | Resp 18 | Ht 66.0 in | Wt 143.6 lb

## 2014-02-06 DIAGNOSIS — Z09 Encounter for follow-up examination after completed treatment for conditions other than malignant neoplasm: Secondary | ICD-10-CM

## 2014-02-06 NOTE — Progress Notes (Signed)
Subjective:     Patient ID: Meghan DibblesBarbara A Kriesel, female   DOB: September 28, 1946, 68 y.o.   MRN: [id number]  HPI 68 year old female comes in for followup after undergoing a laparoscopic incisional hernia repair. Since she was last seen she states she is doing well. She really has no abdominal pain. She will occasionally have a moment of discomfort with certain movements but it only lasts a second or 2. She denies any fevers, chills, nausea, vomiting, diarrhea or constipation. She is actively working on cutting down her smoking. She is down to 4 cigarettes a day  Review of Systems     Objective:   Physical Exam BP 118/84  Pulse 68  Temp(Src) 99.1 F (37.3 C) (Temporal)  Resp 18  Ht 5\' 6"  (1.676 m)  Wt 143 lb 9.6 oz (65.137 kg)  BMI 23.19 kg/m2  Gen: alert, NAD, non-toxic appearing Pupils: equal, no scleral icterus Pulm: Lungs clear to auscultation, symmetric chest rise CV: regular rate and rhythm Abd: soft, nontender, nondistended. Well-healed trocar sites. No cellulitis. No incisional hernia Skin: no rash, no jaundice     Assessment:     Status post laparoscopic ventral incisional hernia repair with mesh     Plan:     Overall she is doing well. There is no sign of hernia recurrence. There is no sign of complications. I encouraged ongoing attempts at smoking cessation. F/u PRN  Mary SellaEric M. Andrey CampanileWilson, MD, FACS General, Bariatric, & Minimally Invasive Surgery [facility], [place]

## 2014-02-06 NOTE — Patient Instructions (Signed)
Work on stopping smoking.

## 2014-02-27 ENCOUNTER — Encounter: Payer: Self-pay | Admitting: Vascular Surgery

## 2014-02-28 ENCOUNTER — Ambulatory Visit (INDEPENDENT_AMBULATORY_CARE_PROVIDER_SITE_OTHER): Payer: BC Managed Care – PPO | Admitting: Vascular Surgery

## 2014-02-28 ENCOUNTER — Encounter: Payer: Self-pay | Admitting: Vascular Surgery

## 2014-02-28 VITALS — BP 124/80 | HR 59 | Resp 18 | Ht 66.0 in | Wt 144.0 lb

## 2014-02-28 DIAGNOSIS — I83893 Varicose veins of bilateral lower extremities with other complications: Secondary | ICD-10-CM

## 2014-02-28 HISTORY — PX: ENDOVENOUS ABLATION SAPHENOUS VEIN W/ LASER: SUR449

## 2014-02-28 NOTE — Progress Notes (Signed)
   Laser Ablation Procedure      Date: 02/28/2014    Meghan DibblesBarbara A Bray DOB:1946/10/24  Consent signed: Yes  Surgeon:T.F. Elisabetta Mishra  Procedure: Laser Ablation: right Greater Saphenous Vein  BP 124/80  Pulse 59  Resp 18  Ht 5\' 6"  (1.676 m)  Wt 144 lb (65.318 kg)  BMI 23.25 kg/m2  Start time: 1045 AM   End time: 12:05 PM  Tumescent Anesthesia: 500 cc 0.9% NaCl with 50 cc Lidocaine HCL with 1% Epi and 15 cc 8.4% NaHCO3  Local Anesthesia: 5 cc Lidocaine HCL and NaHCO3 (ratio 2:1)  Continuous Mode: 15 Watts Total Energy 3131 Joules Total Time3:28    Stab Phlebectomy: 10-20 incisions Sites: Calf and Ankle  Right leg  Patient tolerated procedure well: Yes   Description of Procedure:  After marking the course of the saphenous vein and the secondary varicosities in the standing position, the patient was placed on the operating table in the supine position, and the right leg was prepped and draped in sterile fashion. Local anesthetic was administered, and under ultrasound guidance the saphenous vein was accessed with a micro needle and guide wire; then the micro puncture sheath was placed. A guide wire was inserted to the saphenofemoral junction, followed by a 5 french sheath.  The position of the sheath and then the laser fiber below the junction was confirmed using the ultrasound and visualization of the aiming beam.  Tumescent anesthesia was administered along the course of the saphenous vein using ultrasound guidance. Protective laser glasses were placed on the patient, and the laser was fired at at 15 watt continuous mode.  For a total of 3131 joules.  A steri strip was applied to the puncture site.  The patient was then put into Trendelenburg position.  Local anesthetic was utilized overlying the marked varicosities.  Greater than 10-20 stab wounds were made using the tip of an 11 blade; and using the vein hook,  The phlebectomies were performed using a hemostat to avulse these  varicosities.  Adequate hemostasis was achieved, and steri strips were applied to the stab wound.      ABD pads and thigh high compression stockings were applied.  Ace wrap bandages were applied over the phlebectomy sites and at the top of the saphenofemoral junction.  Blood loss was less than 15 cc.  The patient ambulated out of the operating room having tolerated the procedure well.

## 2014-03-01 ENCOUNTER — Telehealth: Payer: Self-pay | Admitting: *Deleted

## 2014-03-01 NOTE — Telephone Encounter (Signed)
    03/01/2014  Time: 1:17 PM   Patient Name: Meghan DibblesBarbara A Griffith  Patient of: T.F. Early  Procedure:Laser Ablation right greater saphenous vein and stab phlebectomy 10-20 incisions right leg 02-28-2014  Reached patient at home and checked  Her status  Yes    Comments/Actions Taken: Meghan Griffith states no problem with right leg pain, swelling, or bleeding/oozing.  Reviewed post procedural instructions with her and reminded her of post LA duplex and VV FU appointment with Dr. Arbie CookeyEarly on 03-07-2014.     @SIGNATURE @

## 2014-03-06 ENCOUNTER — Encounter: Payer: Self-pay | Admitting: Vascular Surgery

## 2014-03-07 ENCOUNTER — Encounter: Payer: Self-pay | Admitting: Vascular Surgery

## 2014-03-07 ENCOUNTER — Ambulatory Visit (INDEPENDENT_AMBULATORY_CARE_PROVIDER_SITE_OTHER): Payer: BC Managed Care – PPO | Admitting: Vascular Surgery

## 2014-03-07 ENCOUNTER — Ambulatory Visit (HOSPITAL_COMMUNITY)
Admission: RE | Admit: 2014-03-07 | Discharge: 2014-03-07 | Disposition: A | Discharge status: Home / Self Care | Payer: BC Managed Care – PPO | Source: Ambulatory Visit | Attending: Vascular Surgery | Admitting: Vascular Surgery

## 2014-03-07 VITALS — BP 111/66 | HR 62 | Resp 18 | Ht 66.0 in | Wt 143.0 lb

## 2014-03-07 DIAGNOSIS — I83893 Varicose veins of bilateral lower extremities with other complications: Secondary | ICD-10-CM

## 2014-03-07 NOTE — Addendum Note (Signed)
Addended by: Adria DillELDRIDGE-LEWIS, Bobbyjoe Pabst L on: 03/07/2014 01:20 PM   Modules accepted: Orders

## 2014-03-07 NOTE — Progress Notes (Signed)
  The patient presents today for followup of laser ablation of her right great saphenous vein and stab phlebectomy of multiple large curvature branches over her calf on the anterior lateral aspect and onto the dorsum of her foot. She's done quite well for the procedure with the usual amount of mild to moderate discomfort. She is walking normally and has been compliant with her compression garments.  On physical exam she does have good early result with resolution of the very large varicosities she had over her anterior calf. She does have the typical amount of bruising and mild tenderness.  Duplex today her right leg revealed closure of her great saphenous vein to within 7 mm of her saphenofemoral junction. There is no evidence of DVT  She does report increasing difficulty in her left leg. I did image that her vein with the SonoSite on screening method and this does show enlarged saphenous vein with gross reflux throughout her thigh. She does have several scattered varicosities in her calf in her major difficulty as swelling pain and aching around her ankle with large plexus of matted telangiectasia. I explained to her that the pathology is exactly the same as was right leg. She is has not developed very large varicosities in the left leg yet. She does have pain associated with this. Explain to her that the initial treatment would be with elevation and compression. She will begin compression garment usage on her left leg. This is thigh high 20-30 mm mercury. We will see her again in 3 months for continued discussion and formal duplex that time

## 2014-06-03 ENCOUNTER — Encounter: Payer: Self-pay | Admitting: Vascular Surgery

## 2014-06-04 ENCOUNTER — Encounter: Payer: Self-pay | Admitting: Vascular Surgery

## 2014-06-04 ENCOUNTER — Ambulatory Visit (HOSPITAL_COMMUNITY)
Admission: RE | Admit: 2014-06-04 | Discharge: 2014-06-04 | Disposition: A | Discharge status: Home / Self Care | Payer: BC Managed Care – PPO | Source: Ambulatory Visit | Attending: Vascular Surgery | Admitting: Vascular Surgery

## 2014-06-04 ENCOUNTER — Ambulatory Visit (INDEPENDENT_AMBULATORY_CARE_PROVIDER_SITE_OTHER): Payer: BC Managed Care – PPO | Admitting: Vascular Surgery

## 2014-06-04 VITALS — BP 118/84 | HR 72 | Ht 66.0 in | Wt 159.0 lb

## 2014-06-04 DIAGNOSIS — I83893 Varicose veins of bilateral lower extremities with other complications: Secondary | ICD-10-CM | POA: Insufficient documentation

## 2014-06-04 NOTE — Progress Notes (Addendum)
Patient has today for continued followup of left leg venous hypertension. She is status post right great saphenous vein laser ablation and stab phlebectomy and has had marked improvement in her symptoms in her right leg. She has had persistent discomfort in her left leg. This has continued despite graduated compression garments elevation and ibuprofen for discomfort. She has a very strenuous job with prolonged standing and is making this very difficult. She has discomfort over the varicosities and also in her calf and onto her ankle. She has a marked matting of telangiectasia over her ankle on the left.  Past Medical History  Diagnosis Date  . Cataracts, bilateral   . Incontinence   . Chronic headache   . Phlebitis   . Cholelithiasis   . Venous insufficiency (chronic) (peripheral)   . Varicose veins   . Arthritis     History  Substance Use Topics  . Smoking status: Former Smoker -- 1.00 packs/day for 28 years    Quit date: 09/20/2013  . Smokeless tobacco: Never Used  . Alcohol Use: No    Family History  Problem Relation Age of Onset  . Heart disease Mother     Unknown type  . COPD Mother   . Diabetes Mother   . Rheum arthritis Mother   . Hypertension Mother   . Other Mother     varicose veins  . Heart disease Father     Unknown type  . COPD Father   . Hypertension Father   . Cancer Sister     lung  . COPD Sister   . Heart disease Sister     Died of MI at age 66  . Hypertension Sister   . Diabetes Brother   . Heart disease Brother   . Hypertension Brother   . Coronary artery disease    . Hypertension      Allergies  Allergen Reactions  . Lansoprazole Other (See Comments)    GI upset  . Tetracyclines & Related Other (See Comments)    Causes severe abdominal pain  . Prednisone Rash    Flushing/redness in face    Current outpatient prescriptions:aspirin EC 81 MG tablet, Take 81 mg by mouth as needed., Disp: , Rfl: ;  Dextromethorphan-Guaifenesin (MUCINEX DM  MAXIMUM STRENGTH) 60-1200 MG TB12, Take 1 tablet by mouth every 12 (twelve) hours as needed., Disp: , Rfl: ;  gabapentin (NEURONTIN) 300 MG capsule, Take 1 capsule by mouth 3 (three) times daily., Disp: , Rfl:  Ibuprofen (ADVIL) 200 MG CAPS, Take 200 mg by mouth daily as needed (headache)., Disp: , Rfl: ;  loperamide (IMODIUM) 2 MG capsule, Take 2 mg by mouth as needed for diarrhea or loose stools., Disp: , Rfl: ;  Multiple Vitamins-Minerals (MULTIVITAMIN WITH MINERALS) tablet, Take 1 tablet by mouth daily., Disp: , Rfl: ;  Simethicone (GAS-X EXTRA STRENGTH PO), Take 1 tablet by mouth daily as needed (gas)., Disp: , Rfl:  Hydrocodone-Acetaminophen 5-300 MG TABS, Take 1-2 tablets by mouth every 4 (four) hours as needed., Disp: 40 each, Rfl: 0  BP 118/84  Pulse 72  Ht 5\' 6"  (1.676 m)  Wt 159 lb (72.122 kg)  BMI 25.68 kg/m2  SpO2 100%  Body mass index is 25.68 kg/(m^2).       Physical exam she does have 2+ dorsalis pedis pulses bilaterally. She has had persistent nice result from the right leg with no recurrent varicosities. On the left she has large varicosities over her left knee and medial calf.  Formal  venous duplex today reveals reflux throughout her left great saphenous vein with marked dilatation throughout its course. She also has gross reflux in her small saphenous vein with dilatation ranging up to 0.67 mm  Impression and plan: Failed conservative treatment of left leg venous hypertension. Patient is a very good durable successful result regarding her right leg. I have recommended left great saphenous laser ablation and stab phlebectomy followed by small saphenous laser ablation. She does have an area of the plexus of telangiectasia over her left dorsum of her ankle. I have recommended sclerotherapy at this area for reduction of venous hypertension this area and pain relief as well. She understands procedure and slight risk of DVT with procedure. He wished to proceed as soon as possible.  I do to VVS staged nature of these procedures she will require FMLA time off do to the strenuous nature of her work. We will coordinate this with her

## 2014-06-11 ENCOUNTER — Other Ambulatory Visit: Payer: Self-pay | Admitting: *Deleted

## 2014-06-11 DIAGNOSIS — I83893 Varicose veins of bilateral lower extremities with other complications: Secondary | ICD-10-CM

## 2014-07-17 ENCOUNTER — Encounter: Payer: Self-pay | Admitting: Vascular Surgery

## 2014-07-18 ENCOUNTER — Encounter: Payer: Self-pay | Admitting: Vascular Surgery

## 2014-07-18 ENCOUNTER — Ambulatory Visit (INDEPENDENT_AMBULATORY_CARE_PROVIDER_SITE_OTHER): Payer: BC Managed Care – PPO | Admitting: Vascular Surgery

## 2014-07-18 VITALS — BP 118/78 | HR 78 | Resp 18 | Ht 66.0 in | Wt 144.0 lb

## 2014-07-18 DIAGNOSIS — I83893 Varicose veins of bilateral lower extremities with other complications: Secondary | ICD-10-CM

## 2014-07-18 HISTORY — PX: ENDOVENOUS ABLATION SAPHENOUS VEIN W/ LASER: SUR449

## 2014-07-18 NOTE — Progress Notes (Signed)
   Laser Ablation Procedure      Date: 07/18/2014    Meghan DibblesBarbara A Griffith DOB:[birth year]  Consent signed: Yes  Surgeon:T.F. Johnny Latu  Procedure: Laser Ablation: left Greater Saphenous Vein  BP 118/78  Pulse 78  Resp 18  Ht 5\' 6"  (1.676 m)  Wt 144 lb (65.318 kg)  BMI 23.25 kg/m2  Start time: 8:30AM   End time: 10:05 AM  Tumescent Anesthesia: 475 cc 0.9% NaCl with 50 cc Lidocaine HCL with 1% Epi and 15 cc 8.4% NaHCO3  Local Anesthesia: 6 cc Lidocaine HCL and NaHCO3 (ratio 2:1)  Continuous Mode: 15 Watts Total Energy 2689 Joules Total Time2:59   Sclerotherapy: 0.3 %Sotradecol. Patient received a total of 3 cc  Left leg  Stab Phlebectomy: 10-20 Sites: Calf/knee  Left leg   Patient tolerated procedure well: Yes    Description of Procedure:  After marking the course of the saphenous vein and the secondary varicosities in the standing position, the patient was placed on the operating table in the supine position, and the left leg was prepped and draped in sterile fashion. Local anesthetic was administered, and under ultrasound guidance the saphenous vein was accessed with a micro needle and guide wire; then the micro puncture sheath was placed. A guide wire was inserted to the saphenofemoral junction, followed by a 5 french sheath.  The position of the sheath and then the laser fiber below the junction was confirmed using the ultrasound and visualization of the aiming beam.  Tumescent anesthesia was administered along the course of the saphenous vein using ultrasound guidance. Protective laser glasses were placed on the patient, and the laser was fired at at 15 watt continuous mode.  For a total of 2689 joules.  A steri strip was applied to the puncture site.  The patient was then put into Trendelenburg position.  Local anesthetic was utilized overlying the marked varicosities.  Greater than 10-20 stab wounds were made using the tip of an 11 blade; and using the vein hook,  The  phlebectomies were performed using a hemostat to avulse these varicosities.  Adequate hemostasis was achieved, and steri strips were applied to the stab wound.    Sclerotherapy was performed to 3 vessels using 3  cc .3% Sotradecol foam via a 30 gauge needle.  ABD pads and thigh high compression stockings were applied.  Ace wrap bandages were applied over the phlebectomy sites and at the top of the saphenofemoral junction.  Blood loss was less than 15 cc.  The patient ambulated out of the operating room having tolerated the procedure well.

## 2014-07-24 ENCOUNTER — Encounter: Payer: Self-pay | Admitting: Vascular Surgery

## 2014-07-25 ENCOUNTER — Encounter: Payer: Self-pay | Admitting: Vascular Surgery

## 2014-07-25 ENCOUNTER — Ambulatory Visit (INDEPENDENT_AMBULATORY_CARE_PROVIDER_SITE_OTHER): Payer: BC Managed Care – PPO | Admitting: Vascular Surgery

## 2014-07-25 ENCOUNTER — Ambulatory Visit (HOSPITAL_COMMUNITY)
Admission: RE | Admit: 2014-07-25 | Discharge: 2014-07-25 | Disposition: A | Discharge status: Home / Self Care | Payer: BC Managed Care – PPO | Source: Ambulatory Visit | Attending: Vascular Surgery | Admitting: Vascular Surgery

## 2014-07-25 VITALS — BP 130/81 | HR 64 | Resp 16 | Ht 66.0 in | Wt 165.0 lb

## 2014-07-25 DIAGNOSIS — I83893 Varicose veins of bilateral lower extremities with other complications: Secondary | ICD-10-CM | POA: Diagnosis present

## 2014-07-25 NOTE — Progress Notes (Signed)
Did well following her procedure. Typical of soreness in the ablation site and from the phlebectomy sites as well  Physical exam she does have good result from the phlebectomy. 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  Duplex today shows closure of her great saphenous vein and no evidence of DVT.  Impression and plan successful treatment of great saphenous vein reflux with ablation and tributary stab phlebectomy and sclerotherapy. We'll return in one week for small saphenous vein ablation

## 2014-07-29 ENCOUNTER — Telehealth: Payer: Self-pay | Admitting: *Deleted

## 2014-07-29 NOTE — Telephone Encounter (Signed)
    07/29/2014  Time: 9:53 AM   Patient Name: Meghan Griffith  Patient of: T.F. Early  Procedure:Laser Ablation, Sclerotherapy and stab phlebectomy 10-20 incisions left leg  Reached patient at home and checked  Her status  Yes    Comments/Actions Taken: [name] states no problems with left leg pain or swelling.  Reviewed post procedural instructions with her.  Reminded her of post laser ablation duplex and VV FU with Dr. Arbie Cookey on 07-25-2014.        @

## 2014-07-31 ENCOUNTER — Encounter: Payer: Self-pay | Admitting: Vascular Surgery

## 2014-08-01 ENCOUNTER — Encounter: Payer: Self-pay | Admitting: Vascular Surgery

## 2014-08-01 ENCOUNTER — Ambulatory Visit (INDEPENDENT_AMBULATORY_CARE_PROVIDER_SITE_OTHER): Payer: BC Managed Care – PPO | Admitting: Vascular Surgery

## 2014-08-01 VITALS — BP 122/83 | HR 78 | Resp 18 | Ht 66.0 in | Wt 165.0 lb

## 2014-08-01 DIAGNOSIS — I83893 Varicose veins of bilateral lower extremities with other complications: Secondary | ICD-10-CM

## 2014-08-01 HISTORY — PX: ENDOVENOUS ABLATION SAPHENOUS VEIN W/ LASER: SUR449

## 2014-08-01 NOTE — Progress Notes (Signed)
   Laser Ablation Procedure      Date: 08/01/2014    Meghan Griffith DOB:1946-10-01  Consent signed: Yes  Surgeon:T.F. Early  Procedure: Laser Ablation: left Small Saphenous Vein  BP 122/83  Pulse 78  Resp 18  Ht  (1.676 m)  Wt 165 lb (74.844 kg)  BMI 26.64 kg/m2  Start time: 10:15   End time: 10:45  Tumescent Anesthesia: 300 cc 0.9% NaCl with 50 cc Lidocaine HCL with 1% Epi and 15 cc 8.4% NaHCO3  Local Anesthesia: 1 cc Lidocaine HCL and NaHCO3 (ratio 2:1)      Patient tolerated procedure well: Yes    Description of Procedure:  After marking the course of the saphenous vein and the secondary varicosities in the standing position, the patient was placed on the operating table in the prone position, and the left leg was prepped and draped in sterile fashion. Local anesthetic was administered, and under ultrasound guidance the saphenous vein was accessed with a micro needle and guide wire; then the micro puncture sheath was placed. A guide wire was inserted to the saphenopopliteal junction, followed by a 5 french sheath.  The position of the sheath and then the laser fiber below the junction was confirmed using the ultrasound and visualization of the aiming beam.  Tumescent anesthesia was administered along the course of the saphenous vein using ultrasound guidance. Protective laser glasses were placed on the patient, and the laser was fired at at 15 watt continuous mode.  For a total of 1022 joules.  A steri strip was applied to the puncture site.    ABD pads and thigh high compression stockings were applied.  Ace wrap bandages were applied over the phlebectomy sites and at the top of the saphenopopliteal junction.  Blood loss was less than 15 cc.  The patient ambulated out of the operating room having tolerated the procedure well.

## 2014-08-06 ENCOUNTER — Telehealth: Payer: Self-pay | Admitting: *Deleted

## 2014-08-06 NOTE — Telephone Encounter (Signed)
    08/06/2014  Time: 8:52 AM   Patient Name: Meghan Griffith  Patient of: T.F. Early  Procedure:Laser Ablation left small saphenous vein 08-01-2014  Reached patient at home and checked  Her status  Yes    Comments/Actions Taken: Ms. Marandola states that she is having mild discomfort "behind my left knee."  Recommended that she remove white pads from the compression dressing behind her left knee, encouraged her to take Ibuprofen as directed and to use ice packs to area behind left knee.  Reviewed all post procedural instructions with her and reminded her of post laser ablation duplex and VV FU with Dr. Arbie Cookey on 08-08-2014.       @

## 2014-08-07 ENCOUNTER — Encounter: Payer: Self-pay | Admitting: Vascular Surgery

## 2014-08-08 ENCOUNTER — Ambulatory Visit (HOSPITAL_COMMUNITY)
Admission: RE | Admit: 2014-08-08 | Discharge: 2014-08-08 | Disposition: A | Discharge status: Home / Self Care | Payer: BC Managed Care – PPO | Source: Ambulatory Visit | Attending: Vascular Surgery | Admitting: Vascular Surgery

## 2014-08-08 ENCOUNTER — Encounter: Payer: Self-pay | Admitting: Vascular Surgery

## 2014-08-08 ENCOUNTER — Ambulatory Visit (INDEPENDENT_AMBULATORY_CARE_PROVIDER_SITE_OTHER): Payer: Self-pay | Admitting: Vascular Surgery

## 2014-08-08 VITALS — BP 116/80 | HR 66 | Ht 66.0 in | Wt 163.7 lb

## 2014-08-08 DIAGNOSIS — I83893 Varicose veins of bilateral lower extremities with other complications: Secondary | ICD-10-CM | POA: Diagnosis present

## 2014-08-08 NOTE — Progress Notes (Signed)
Here today for followup of her small saphenous vein ablation on the left one week ago. She had similar treatment for her great saphenous vein several weeks before and stab phlebectomy. Still is having some mild discomfort with stab phlebectomy sites. She reports minimal discomfort at the ablation sites.  Physical exam she does not have any evidence of skin irritation. The ablation sites are healing. The phlebectomy sites have resolution of the subcutaneous hematoma.  Venous duplex today reveals successful closure of her left small saphenous vein with no evidence of DVT  Patient were compression garments one additional week. Will see [place] again in as-needed basis

## 2014-11-11 ENCOUNTER — Other Ambulatory Visit: Payer: Self-pay | Admitting: Ophthalmology

## 2016-03-04 DIAGNOSIS — E669 Obesity, unspecified: Secondary | ICD-10-CM | POA: Diagnosis not present

## 2016-03-04 DIAGNOSIS — R109 Unspecified abdominal pain: Secondary | ICD-10-CM | POA: Diagnosis not present

## 2016-08-24 ENCOUNTER — Ambulatory Visit
Admission: RE | Admit: 2016-08-24 | Discharge: 2016-08-24 | Disposition: A | Discharge status: Home / Self Care | Payer: PPO | Source: Ambulatory Visit | Attending: Physician Assistant | Admitting: Physician Assistant

## 2016-08-24 ENCOUNTER — Other Ambulatory Visit: Payer: Self-pay | Admitting: Physician Assistant

## 2016-08-24 DIAGNOSIS — R0602 Shortness of breath: Secondary | ICD-10-CM

## 2016-08-24 DIAGNOSIS — R600 Localized edema: Secondary | ICD-10-CM | POA: Diagnosis not present

## 2016-08-24 DIAGNOSIS — I493 Ventricular premature depolarization: Secondary | ICD-10-CM | POA: Diagnosis not present

## 2016-08-24 MED ORDER — IOPAMIDOL (ISOVUE-370) INJECTION 76%
75.0000 mL | Freq: Once | INTRAVENOUS | Status: AC | PRN
  Administered 2016-08-24: 75 mL via INTRAVENOUS

## 2016-08-26 DIAGNOSIS — R0602 Shortness of breath: Secondary | ICD-10-CM | POA: Diagnosis not present

## 2016-08-26 DIAGNOSIS — I493 Ventricular premature depolarization: Secondary | ICD-10-CM | POA: Diagnosis not present

## 2016-08-31 ENCOUNTER — Telehealth: Payer: Self-pay | Admitting: Cardiovascular Disease

## 2016-08-31 NOTE — Telephone Encounter (Signed)
Records received [facility] for apt on 09/15/16 with Dr Orpha Burritouri. Records given to [employer] (medical records) CN

## 2016-09-15 ENCOUNTER — Encounter (INDEPENDENT_AMBULATORY_CARE_PROVIDER_SITE_OTHER): Payer: Self-pay

## 2016-09-15 ENCOUNTER — Ambulatory Visit (INDEPENDENT_AMBULATORY_CARE_PROVIDER_SITE_OTHER): Payer: PPO | Admitting: Cardiovascular Disease

## 2016-09-15 ENCOUNTER — Encounter: Payer: Self-pay | Admitting: Cardiovascular Disease

## 2016-09-15 VITALS — BP 133/93 | HR 98 | Ht 66.0 in | Wt 209.8 lb

## 2016-09-15 DIAGNOSIS — D751 Secondary polycythemia: Secondary | ICD-10-CM

## 2016-09-15 DIAGNOSIS — Z720 Tobacco use: Secondary | ICD-10-CM

## 2016-09-15 DIAGNOSIS — Z13220 Encounter for screening for lipoid disorders: Secondary | ICD-10-CM | POA: Diagnosis not present

## 2016-09-15 DIAGNOSIS — R0602 Shortness of breath: Secondary | ICD-10-CM

## 2016-09-15 DIAGNOSIS — I10 Essential (primary) hypertension: Secondary | ICD-10-CM | POA: Diagnosis not present

## 2016-09-15 DIAGNOSIS — I2510 Atherosclerotic heart disease of native coronary artery without angina pectoris: Secondary | ICD-10-CM

## 2016-09-15 DIAGNOSIS — I700 Atherosclerosis of aorta: Secondary | ICD-10-CM

## 2016-09-15 DIAGNOSIS — I2584 Coronary atherosclerosis due to calcified coronary lesion: Secondary | ICD-10-CM

## 2016-09-15 LAB — LIPID PANEL
Cholesterol: 213 mg/dL — ABNORMAL HIGH (ref 125–200)
HDL: 49 mg/dL (ref >=46)
LDL CALC: 152 mg/dL — AB (ref <130)
Total CHOL/HDL Ratio: 4.3 Ratio (ref <=5.0)
Triglycerides: 61 mg/dL (ref <150)
VLDL: 12 mg/dL (ref <30)

## 2016-09-15 MED ORDER — FUROSEMIDE 40 MG PO TABS: 40.0000 mg | ORAL_TABLET | ORAL | 11 refills | Status: DC

## 2016-09-15 NOTE — Progress Notes (Signed)
Cardiology Consultation Note    Date:  09/15/2016   ID:  Meghan DibblesBarbara A Hunn, DOB [birth year], MRN [id number]  PCP:  Warrick ParisianSTALLINGS,SHEILA, MD (Inactive)  Cardiologist:   Thurmon FairMihai Patryck Kilgore, MD  Referred by: Rueben BashBreejante Williams, [facility] Reason for consultation: Dyspnea  Chief Complaint  Patient presents with  . New Patient (Initial Visit)    patient reports shortness of breath. edema; in ankles and feet.     History of Present Illness:  Meghan Griffith is a 70 y.o. female referred in consultation for shortness of breath. She describes shortness of breath with moderate activity, occurring over the last 6 months. For example after taking a shower and drying off she becomes short of breath that she begins to close herself. She can do housework without much difficulty. She has occasional wheezing and uses albuterol. She smokes roughly half a pack of cigarettes a day. She has smoked for over 30 years, but had come close to quitting about a year ago before she retired.  He denies exertional chest discomfort or any chest symptoms at rest. She has not had syncope, palpitations, claudication or focal neurological complaints. She has off-and-on chronic leg edema which has been attributed to chronic venous insufficiency. She takes hydrochlorothiazide a few times a week for this. She has noticed that taking the diuretic also helps improve her dyspnea and is worried that she has congestive heart failure.  CT angiogram of the chest did not show pulmonary embolism in September 2017, but it did show "considerable coronary artery calcification" and aortic atherosclerosis. She had venous ablation for varicose veins by Dr. Tawanna Coolerodd early in August 2017.  BNP checked on September 19 was normal at 43. Her hemoglobin that same day was 16.8, glucose 90, creatinine 0.88, normal liver function tests her electrocardiogram is normal  In 2013 she had a CT angiogram did not show pulmonary embolism and had a report of normal  PFTs. She had a normal Lexiscan Myoview with EF 68%. 2006 she had an echocardiogram that showed low normal LV function with EF 50-55 percent and mitral inflow consistent with abnormal relaxation, but no wall motion abnormalities or valvular problems.  Past Medical History:  Diagnosis Date  . Arthritis   . Cataracts, bilateral   . Cholelithiasis   . Chronic headache   . Incontinence   . Lower leg edema   . Phlebitis   . PVC (premature ventricular contraction)   . SOB (shortness of breath)   . Varicose veins   . Venous insufficiency (chronic) (peripheral)     Past Surgical History:  Procedure Laterality Date  . CHOLECYSTECTOMY    . collagen     injections  . ENDOVENOUS ABLATION SAPHENOUS VEIN W/ LASER Right 02-28-2014   right greater saphenous vein and stab phlebectomy 10-20 incisions right leg  by Gretta Beganodd Early MD  . ENDOVENOUS ABLATION SAPHENOUS VEIN W/ LASER Left 07-18-2014   EVLA left greater saphenous vein, stab phlebectomy 10-20 incisions left leg , and sclerotherapy left leg    . ENDOVENOUS ABLATION SAPHENOUS VEIN W/ LASER Left 08-01-2014   EVLA  left small saphenous vein  by Gretta Beganodd Early MD  . HERNIA REPAIR    . INCISIONAL HERNIA REPAIR N/A 10/26/2013   Procedure: LAPAROSCOPIC INCISIONAL HERNIA;  Surgeon: Atilano InaEric M Wilson, MD;  Location: [facility];  Service: General;  Laterality: N/A;  . INSERTION OF MESH N/A 10/26/2013   Procedure: INSERTION OF MESH;  Surgeon: Atilano InaEric M Wilson, MD;  Location: [facility];  Service: General;  Laterality:  N/A;  . KNEE ARTHROSCOPY  2002   left knee  . LAPAROSCOPIC CHOLECYSTECTOMY W/ CHOLANGIOGRAPHY  07/30/11  . NOSE SURGERY    . TUBAL LIGATION  1976  . vaginal sling      Current Medications: Outpatient Medications Prior to Visit  Medication Sig Dispense Refill  . albuterol (PROVENTIL HFA;VENTOLIN HFA) 108 (90 Base) MCG/ACT inhaler Inhale 1-2 puffs into the lungs every 6 (six) hours as needed for wheezing or shortness of breath.    Marland Kitchen aspirin EC 81 MG  tablet Take 81 mg by mouth as needed.    . Multiple Vitamins-Minerals (MULTIVITAMIN WITH MINERALS) tablet Take 1 tablet by mouth daily.     No facility-administered medications prior to visit.      Allergies:   Erythromycin base; Lansoprazole; Prednisone; and Tetracyclines & related   Social History   Social History  . Marital status: Single    Spouse name: N/A  . Number of children: 2  . Years of education: N/A   Occupational History  . textiles [employer]   Social History Main Topics  . Smoking status: Former Smoker    Packs/day: 1.00    Years: 28.00    Quit date: 09/20/2013  . Smokeless tobacco: Never Used  . Alcohol use No  . Drug use: No  . Sexual activity: Not Asked   Other Topics Concern  . None   Social History Narrative   Pt is single.     Family History:  The patient's family history includes COPD in her father, mother, and sister; Cancer in her sister; Diabetes in her brother and mother; Heart disease in her brother, father, mother, and sister; Hypertension in her brother, father, mother, and sister; Other in her mother; Rheum arthritis in her mother.   ROS:   Please see the history of present illness.    ROS All other systems reviewed and are negative.   PHYSICAL EXAM:   VS:  BP (!) 133/93   Pulse 98   Ht 5\' 6"  (1.676 m)   Wt 209 lb 12.8 oz (95.2 kg)   BMI 33.86 kg/m    GEN: Well nourished, well developed, in no acute distress  HEENT: normal  Neck: no JVD, carotid bruits, or masses Cardiac: RRR; no murmurs, rubs, or gallops,no edema  Respiratory:  clear to auscultation bilaterally, normal work of breathing GI: soft, nontender, nondistended, + BS MS: no deformity or atrophy  Skin: warm and dry, no rash Neuro:  Alert and Oriented x 3, Strength and sensation are intact Psych: euthymic mood, full affect  Wt Readings from Last 3 Encounters:  09/15/16 209 lb 12.8 oz (95.2 kg)  08/08/14 163 lb 11.2 oz (74.3 kg)  08/01/14 165 lb (74.8 kg)       Studies/Labs Reviewed:   EKG:  EKG is ordered today.  The ekg ordered today demonstrates Sinus rhythm, isolated tiny Q waves and small T-wave inversion in lead III, otherwise normal  Recent Labs: See HPI No results found for: CHOL, TRIG, HDL, CHOLHDL, VLDL, LDLCALC, LDLDIRECT  Additional studies/ records that were reviewed today include:  Notes from [place], [place]. Old office notes from Olga Millers 2013, echo from 2006, nuclear stress test 2013, notes and PFTs from Dr. Shelle Iron 2013    ASSESSMENT:    1. Coronary artery calcification seen on CT scan   2. Shortness of breath   3. Essential hypertension   4. Polycythemia   5. Tobacco abuse   6. Aortic atherosclerosis (HCC)   7.  Screening, lipid      PLAN:  In order of problems listed above:  1. Coronary calcification: Had a normal nuclear stress test, but this was 4 years ago. I think a repeat functional study is in order since she has worsening dyspnea. She does not have angina pectoris. Check her lipid profile. 2. Dyspnea: I think there is a good chance that this is related to smoking, although she disagrees with me. She did have normal PFTs in 2013 according to Dr. Teddy Spike evaluation. I did encourage her to quit smoking. Repeat an echocardiogram since the previous echo suggested low normal systolic function and did show abnormal relaxation. Her recent BNP was completely normal, but she does report improved dyspnea when she takes diuretics. Ultimately, may have to resort to right and left heart catheterization to clarify whether her dyspnea is primarily pulmonary or cardiac in etiology. 3. HTN: Blood pressure is elevated today, especially her diastolic blood pressure. It remained elevated even when I checked it 20 minutes later. She may require antihypertensives. If the echocardiogram shows LVH with probably add RAAS inhibitor. Will start medications after we have the results of her stress test and echo. 4. Polycythemia: I think  this is related to chronic hypoxia. She only smokes half a pack a day and I would take it to be proof of underlying COPD, not withstanding her "normal spirometry" in the past 5. Tobacco abuse: Smoking cessation recommended. 6. Aortic atherosclerosis: Check lipids today. Target LDL less than 100, preferably less than 70    Medication Adjustments/Labs and Tests Ordered: Current medicines are reviewed at length with the patient today.  Concerns regarding medicines are outlined above.  Medication changes, Labs and Tests ordered today are listed in the Patient Instructions below. Patient Instructions  Medication Instructions: Dr Royann Shivers has recommended making the following medication changes: 1. STOP Hydrochlorothiazide 2. START Furosemide 40 mg - take 1 tablet 3 times weekly - Mondays, Wednesdays, and Fridays  Labwork: Your physician recommends that you return for lab work at your earliest convenience - FASTING.  Testing/Procedures: 1. Echocardiogram - Your physician has requested that you have an echocardiogram. Echocardiography is a painless test that uses sound waves to create images of your heart. It provides your doctor with information about the size and shape of your heart and how well your heart's chambers and valves are working. This procedure takes approximately one hour. There are no restrictions for this procedure. This will be performed at our [facility] location - [address].  2. Exercise Myoview Stress test - Your physician has requested that you have en exercise stress myoview. For further information please visit [email or web address] Please follow instruction sheet, as given.  Follow-up: Dr Royann Shivers recommends that you schedule a follow-up appointment after testing has been completed.  If you need a refill on your cardiac medications before your next appointment, please call your pharmacy.   Potassium Content of Foods Potassium is a mineral found in many  foods and drinks. It helps keep fluids and minerals balanced in your body and affects how steadily your heart beats. Potassium also helps control your blood pressure and keep your muscles and nervous system healthy. Certain health conditions and medicines may change the balance of potassium in your body. When this happens, you can help balance your level of potassium through the foods that you do or do not eat. Your health care provider or dietitian may recommend an amount of potassium that you should have each day. The  following lists of foods provide the amount of potassium (in parentheses) per serving in each item. HIGH IN POTASSIUM  The following foods and beverages have 200 mg or more of potassium per serving:  Apricots, 2 raw or 5 dry (200 mg).  Artichoke, 1 medium (345 mg).  Avocado, raw,  each (245 mg).  Banana, 1 medium (425 mg).  Beans, lima, or baked beans, canned,  cup (280 mg).  Beans, white, canned,  cup (595 mg).  Beef roast, 3 oz (320 mg).  Beef, ground, 3 oz (270 mg).  Beets, raw or cooked,  cup (260 mg).  Bran muffin, 2 oz (300 mg).  Broccoli,  cup (230 mg).  Brussels sprouts,  cup (250 mg).  Cantaloupe,  cup (215 mg).  Cereal, 100% bran,  cup (200-400 mg).  Cheeseburger, single, fast food, 1 each (225-400 mg).  Chicken, 3 oz (220 mg).  Clams, canned, 3 oz (535 mg).  Crab, 3 oz (225 mg).  Dates, 5 each (270 mg).  Dried beans and peas,  cup (300-475 mg).  Figs, dried, 2 each (260 mg).  Fish: halibut, tuna, cod, snapper, 3 oz (480 mg).  Fish: salmon, haddock, swordfish, perch, 3 oz (300 mg).  Fish, tuna, canned 3 oz (200 mg).  [place] fries, fast food, 3 oz (470 mg).  Granola with fruit and nuts,  cup (200 mg).  Grapefruit juice,  cup (200 mg).  Greens, beet,  cup (655 mg).  Honeydew melon,  cup (200 mg).  Kale, raw, 1 cup (300 mg).  Kiwi, 1 medium (240 mg).  Kohlrabi, rutabaga, parsnips,  cup (280 mg).  Lentils,  cup  (365 mg).  Mango, 1 each (325 mg).  Milk, chocolate, 1 cup (420 mg).  Milk: nonfat, low-fat, whole, buttermilk, 1 cup (350-380 mg).  Molasses, 1 Tbsp (295 mg).  Mushrooms,  cup (280) mg.  Nectarine, 1 each (275 mg).  Nuts: almonds, peanuts, hazelnuts, [place], cashew, mixed, 1 oz (200 mg).  Nuts, pistachios, 1 oz (295 mg).  Orange, 1 each (240 mg).  Orange juice,  cup (235 mg).  Papaya, medium,  fruit (390 mg).  Peanut butter, chunky, 2 Tbsp (240 mg).  Peanut butter, smooth, 2 Tbsp (210 mg).  Pear, 1 medium (200 mg).  Pomegranate, 1 whole (400 mg).  Pomegranate juice,  cup (215 mg).  Pork, 3 oz (350 mg).  Potato chips, salted, 1 oz (465 mg).  Potato, baked with skin, 1 medium (925 mg).  Potatoes, boiled,  cup (255 mg).  Potatoes, mashed,  cup (330 mg).  Prune juice,  cup (370 mg).  Prunes, 5 each (305 mg).  Pudding, chocolate,  cup (230 mg).  Pumpkin, canned,  cup (250 mg).  Raisins, seedless,  cup (270 mg).  Seeds, sunflower or pumpkin, 1 oz (240 mg).  Soy milk, 1 cup (300 mg).  Spinach,  cup (420 mg).  Spinach, canned,  cup (370 mg).  Sweet potato, baked with skin, 1 medium (450 mg).  Swiss chard,  cup (480 mg).  Tomato or vegetable juice,  cup (275 mg).  Tomato sauce or puree,  cup (400-550 mg).  Tomato, raw, 1 medium (290 mg).  Tomatoes, canned,  cup (200-300 mg).  [place], 3 oz (250 mg).  Wheat germ, 1 oz (250 mg).  Winter squash,  cup (250 mg).  Yogurt, plain or fruited, 6 oz (260-435 mg).  Zucchini,  cup (220 mg). MODERATE IN POTASSIUM The following foods and beverages have 50-200 mg of potassium per serving:  Apple,  1 each (150 mg).  Apple juice,  cup (150 mg).  Applesauce,  cup (90 mg).  Apricot nectar,  cup (140 mg).  Asparagus, small spears,  cup or 6 spears (155 mg).  Bagel, cinnamon raisin, 1 each (130 mg).  Bagel, egg or plain, 4 in., 1 each (70 mg).  Beans, green,  cup (90  mg).  Beans, yellow,  cup (190 mg).  Beer, regular, 12 oz (100 mg).  Beets, canned,  cup (125 mg).  Blackberries,  cup (115 mg).  Blueberries,  cup (60 mg).  Bread, whole wheat, 1 slice (70 mg).  Broccoli, raw,  cup (145 mg).  Cabbage,  cup (150 mg).  Carrots, cooked or raw,  cup (180 mg).  Cauliflower, raw,  cup (150 mg).  Celery, raw,  cup (155 mg).  Cereal, bran flakes, cup (120-150 mg).  Cheese, cottage,  cup (110 mg).  Cherries, 10 each (150 mg).  Chocolate, 1 oz bar (165 mg).  Coffee, brewed 6 oz (90 mg).  Corn,  cup or 1 ear (195 mg).  Cucumbers,  cup (80 mg).  Egg, large, 1 each (60 mg).  Eggplant,  cup (60 mg).  Endive, raw, cup (80 mg).  English muffin, 1 each (65 mg).  Fish, orange roughy, 3 oz (150 mg).  Frankfurter, beef or pork, 1 each (75 mg).  Fruit cocktail,  cup (115 mg).  Grape juice,  cup (170 mg).  Grapefruit,  fruit (175 mg).  Grapes,  cup (155 mg).  Greens: kale, turnip, collard,  cup (110-150 mg).  Ice cream or frozen yogurt, chocolate,  cup (175 mg).  Ice cream or frozen yogurt, vanilla,  cup (120-150 mg).  Lemons, limes, 1 each (80 mg).  Lettuce, all types, 1 cup (100 mg).  Mixed vegetables,  cup (150 mg).  Mushrooms, raw,  cup (110 mg).  Nuts: walnuts, pecans, or macadamia, 1 oz (125 mg).  Oatmeal,  cup (80 mg).  Okra,  cup (110 mg).  Onions, raw,  cup (120 mg).  Peach, 1 each (185 mg).  Peaches, canned,  cup (120 mg).  Pears, canned,  cup (120 mg).  Peas, green, frozen,  cup (90 mg).  Peppers, green,  cup (130 mg).  Peppers, red,  cup (160 mg).  Pineapple juice,  cup (165 mg).  Pineapple, fresh or canned,  cup (100 mg).  Plums, 1 each (105 mg).  Pudding, vanilla,  cup (150 mg).  Raspberries,  cup (90 mg).  Rhubarb,  cup (115 mg).  Rice, wild,  cup (80 mg).  Shrimp, 3 oz (155 mg).  Spinach, raw, 1 cup (170 mg).  Strawberries,  cup (125  mg).  Summer squash  cup (175-200 mg).  Swiss chard, raw, 1 cup (135 mg).  Tangerines, 1 each (140 mg).  Tea, brewed, 6 oz (65 mg).  Turnips,  cup (140 mg).  Watermelon,  cup (85 mg).  Wine, red, table, 5 oz (180 mg).  Wine, white, table, 5 oz (100 mg). LOW IN POTASSIUM The following foods and beverages have less than 50 mg of potassium per serving.  Bread, white, 1 slice (30 mg).  Carbonated beverages, 12 oz (less than 5 mg).  Cheese, 1 oz (20-30 mg).  Cranberries,  cup (45 mg).  Cranberry juice cocktail,  cup (20 mg).  Fats and oils, 1 Tbsp (less than 5 mg).  Hummus, 1 Tbsp (32 mg).  Nectar: papaya, mango, or pear,  cup (35 mg).  Rice, white or brown,  cup (50 mg).  Spaghetti or macaroni,  cup cooked (30 mg).  Tortilla, flour or corn, 1 each (50 mg).  Waffle, 4 in., 1 each (50 mg).  Water chestnuts,  cup (40 mg).   This information is not intended to replace advice given to you by your health care provider. Make sure you discuss any questions you have with your health care provider.   Document Released: 07/06/2005 Document Revised: 11/27/2013 Document Reviewed: 10/19/2013 Elsevier Interactive Patient Education [address]     Signed, Thurmon Fair, MD  09/15/2016 10:49 AM    [facility] [address] [place], [place], [place]  [address] Phone: [phone]; Fax: [phone]

## 2016-09-15 NOTE — Patient Instructions (Signed)
Medication Instructions: Dr Royann Shivers has recommended making the following medication changes: 1. STOP Hydrochlorothiazide 2. START Furosemide 40 mg - take 1 tablet 3 times weekly - Mondays, Wednesdays, and Fridays  Labwork: Your physician recommends that you return for lab work at your earliest convenience - FASTING.  Testing/Procedures: 1. Echocardiogram - Your physician has requested that you have an echocardiogram. Echocardiography is a painless test that uses sound waves to create images of your heart. It provides your doctor with information about the size and shape of your heart and how well your heart's chambers and valves are working. This procedure takes approximately one hour. There are no restrictions for this procedure. This will be performed at our [facility] location - [address], [address].  2. Exercise Myoview Stress test - Your physician has requested that you have en exercise stress myoview. For further information please visit [email or web address] Please follow instruction sheet, as given.  Follow-up: Dr Royann Shivers recommends that you schedule a follow-up appointment after testing has been completed.  If you need a refill on your cardiac medications before your next appointment, please call your pharmacy.   Potassium Content of Foods Potassium is a mineral found in many foods and drinks. It helps keep fluids and minerals balanced in your body and affects how steadily your heart beats. Potassium also helps control your blood pressure and keep your muscles and nervous system healthy. Certain health conditions and medicines may change the balance of potassium in your body. When this happens, you can help balance your level of potassium through the foods that you do or do not eat. Your health care provider or dietitian may recommend an amount of potassium that you should have each day. The following lists of foods provide the amount of potassium (in parentheses) per serving in  each item. HIGH IN POTASSIUM  The following foods and beverages have 200 mg or more of potassium per serving:  Apricots, 2 raw or 5 dry (200 mg).  Artichoke, 1 medium (345 mg).  Avocado, raw,  each (245 mg).  Banana, 1 medium (425 mg).  Beans, lima, or baked beans, canned,  cup (280 mg).  Beans, white, canned,  cup (595 mg).  Beef roast, 3 oz (320 mg).  Beef, ground, 3 oz (270 mg).  Beets, raw or cooked,  cup (260 mg).  Bran muffin, 2 oz (300 mg).  Broccoli,  cup (230 mg).  Brussels sprouts,  cup (250 mg).  Cantaloupe,  cup (215 mg).  Cereal, 100% bran,  cup (200-400 mg).  Cheeseburger, single, fast food, 1 each (225-400 mg).  Chicken, 3 oz (220 mg).  Clams, canned, 3 oz (535 mg).  Crab, 3 oz (225 mg).  Dates, 5 each (270 mg).  Dried beans and peas,  cup (300-475 mg).  Figs, dried, 2 each (260 mg).  Fish: halibut, tuna, cod, snapper, 3 oz (480 mg).  Fish: salmon, haddock, swordfish, perch, 3 oz (300 mg).  Fish, tuna, canned 3 oz (200 mg).  [place] fries, fast food, 3 oz (470 mg).  Granola with fruit and nuts,  cup (200 mg).  Grapefruit juice,  cup (200 mg).  Greens, beet,  cup (655 mg).  Honeydew melon,  cup (200 mg).  Kale, raw, 1 cup (300 mg).  Kiwi, 1 medium (240 mg).  Kohlrabi, rutabaga, parsnips,  cup (280 mg).  Lentils,  cup (365 mg).  Mango, 1 each (325 mg).  Milk, chocolate, 1 cup (420 mg).  Milk: nonfat, low-fat, whole, buttermilk, 1  cup (350-380 mg).  Molasses, 1 Tbsp (295 mg).  Mushrooms,  cup (280) mg.  Nectarine, 1 each (275 mg).  Nuts: almonds, peanuts, hazelnuts, [place], cashew, mixed, 1 oz (200 mg).  Nuts, pistachios, 1 oz (295 mg).  Orange, 1 each (240 mg).  Orange juice,  cup (235 mg).  Papaya, medium,  fruit (390 mg).  Peanut butter, chunky, 2 Tbsp (240 mg).  Peanut butter, smooth, 2 Tbsp (210 mg).  Pear, 1 medium (200 mg).  Pomegranate, 1 whole (400 mg).  Pomegranate juice,  cup  (215 mg).  Pork, 3 oz (350 mg).  Potato chips, salted, 1 oz (465 mg).  Potato, baked with skin, 1 medium (925 mg).  Potatoes, boiled,  cup (255 mg).  Potatoes, mashed,  cup (330 mg).  Prune juice,  cup (370 mg).  Prunes, 5 each (305 mg).  Pudding, chocolate,  cup (230 mg).  Pumpkin, canned,  cup (250 mg).  Raisins, seedless,  cup (270 mg).  Seeds, sunflower or pumpkin, 1 oz (240 mg).  Soy milk, 1 cup (300 mg).  Spinach,  cup (420 mg).  Spinach, canned,  cup (370 mg).  Sweet potato, baked with skin, 1 medium (450 mg).  Swiss chard,  cup (480 mg).  Tomato or vegetable juice,  cup (275 mg).  Tomato sauce or puree,  cup (400-550 mg).  Tomato, raw, 1 medium (290 mg).  Tomatoes, canned,  cup (200-300 mg).  [place], 3 oz (250 mg).  Wheat germ, 1 oz (250 mg).  Winter squash,  cup (250 mg).  Yogurt, plain or fruited, 6 oz (260-435 mg).  Zucchini,  cup (220 mg). MODERATE IN POTASSIUM The following foods and beverages have 50-200 mg of potassium per serving:  Apple, 1 each (150 mg).  Apple juice,  cup (150 mg).  Applesauce,  cup (90 mg).  Apricot nectar,  cup (140 mg).  Asparagus, small spears,  cup or 6 spears (155 mg).  Bagel, cinnamon raisin, 1 each (130 mg).  Bagel, egg or plain, 4 in., 1 each (70 mg).  Beans, green,  cup (90 mg).  Beans, yellow,  cup (190 mg).  Beer, regular, 12 oz (100 mg).  Beets, canned,  cup (125 mg).  Blackberries,  cup (115 mg).  Blueberries,  cup (60 mg).  Bread, whole wheat, 1 slice (70 mg).  Broccoli, raw,  cup (145 mg).  Cabbage,  cup (150 mg).  Carrots, cooked or raw,  cup (180 mg).  Cauliflower, raw,  cup (150 mg).  Celery, raw,  cup (155 mg).  Cereal, bran flakes, cup (120-150 mg).  Cheese, cottage,  cup (110 mg).  Cherries, 10 each (150 mg).  Chocolate, 1 oz bar (165 mg).  Coffee, brewed 6 oz (90 mg).  Corn,  cup or 1 ear (195 mg).  Cucumbers,  cup (80  mg).  Egg, large, 1 each (60 mg).  Eggplant,  cup (60 mg).  Endive, raw, cup (80 mg).  English muffin, 1 each (65 mg).  Fish, orange roughy, 3 oz (150 mg).  Frankfurter, beef or pork, 1 each (75 mg).  Fruit cocktail,  cup (115 mg).  Grape juice,  cup (170 mg).  Grapefruit,  fruit (175 mg).  Grapes,  cup (155 mg).  Greens: kale, turnip, collard,  cup (110-150 mg).  Ice cream or frozen yogurt, chocolate,  cup (175 mg).  Ice cream or frozen yogurt, vanilla,  cup (120-150 mg).  Lemons, limes, 1 each (80 mg).  Lettuce, all types, 1 cup (100 mg).  Mixed vegetables,  cup (150 mg).  Mushrooms, raw,  cup (110 mg).  Nuts: walnuts, pecans, or macadamia, 1 oz (125 mg).  Oatmeal,  cup (80 mg).  Okra,  cup (110 mg).  Onions, raw,  cup (120 mg).  Peach, 1 each (185 mg).  Peaches, canned,  cup (120 mg).  Pears, canned,  cup (120 mg).  Peas, green, frozen,  cup (90 mg).  Peppers, green,  cup (130 mg).  Peppers, red,  cup (160 mg).  Pineapple juice,  cup (165 mg).  Pineapple, fresh or canned,  cup (100 mg).  Plums, 1 each (105 mg).  Pudding, vanilla,  cup (150 mg).  Raspberries,  cup (90 mg).  Rhubarb,  cup (115 mg).  Rice, wild,  cup (80 mg).  Shrimp, 3 oz (155 mg).  Spinach, raw, 1 cup (170 mg).  Strawberries,  cup (125 mg).  Summer squash  cup (175-200 mg).  Swiss chard, raw, 1 cup (135 mg).  Tangerines, 1 each (140 mg).  Tea, brewed, 6 oz (65 mg).  Turnips,  cup (140 mg).  Watermelon,  cup (85 mg).  Wine, red, table, 5 oz (180 mg).  Wine, white, table, 5 oz (100 mg). LOW IN POTASSIUM The following foods and beverages have less than 50 mg of potassium per serving.  Bread, white, 1 slice (30 mg).  Carbonated beverages, 12 oz (less than 5 mg).  Cheese, 1 oz (20-30 mg).  Cranberries,  cup (45 mg).  Cranberry juice cocktail,  cup (20 mg).  Fats and oils, 1 Tbsp (less than 5 mg).  Hummus, 1 Tbsp (32  mg).  Nectar: papaya, mango, or pear,  cup (35 mg).  Rice, white or brown,  cup (50 mg).  Spaghetti or macaroni,  cup cooked (30 mg).  Tortilla, flour or corn, 1 each (50 mg).  Waffle, 4 in., 1 each (50 mg).  Water chestnuts,  cup (40 mg).   This information is not intended to replace advice given to you by your health care provider. Make sure you discuss any questions you have with your health care provider.   Document Released: 07/06/2005 Document Revised: 11/27/2013 Document Reviewed: 10/19/2013 Elsevier Interactive Patient Education [employer]2016 Elsevier Inc.

## 2016-09-17 ENCOUNTER — Telehealth: Payer: Self-pay

## 2016-09-17 DIAGNOSIS — E785 Hyperlipidemia, unspecified: Secondary | ICD-10-CM

## 2016-09-17 DIAGNOSIS — Z79899 Other long term (current) drug therapy: Secondary | ICD-10-CM

## 2016-09-17 MED ORDER — ATORVASTATIN CALCIUM 40 MG PO TABS: 40.0000 mg | ORAL_TABLET | Freq: Every day | ORAL | 11 refills | Status: DC

## 2016-09-17 NOTE — Telephone Encounter (Signed)
Called patient with results. Patient verbalized understanding and agreed with plan. Medication sent to patient's preferred pharmacy. Lab slip mailed to patient.

## 2016-09-17 NOTE — Telephone Encounter (Signed)
-----   Message from Thurmon FairMihai Croitoru, MD sent at 09/16/2016  1:04 PM EDT ----- Cholesterol is high and especially the LDL (bad) cholesterol is too high, regardless of what the rest of our workup shows. I think she needs to start a cholesterol-lowering medication and recheck labs in 3 months. If she agrees please prescribe atorvastatin 40 mg once daily.

## 2016-09-22 ENCOUNTER — Telehealth (HOSPITAL_COMMUNITY): Payer: Self-pay

## 2016-09-22 NOTE — Telephone Encounter (Signed)
Encounter complete. 

## 2016-09-24 ENCOUNTER — Ambulatory Visit (HOSPITAL_COMMUNITY)
Admission: RE | Admit: 2016-09-24 | Discharge: 2016-09-24 | Disposition: A | Discharge status: Home / Self Care | Payer: PPO | Source: Ambulatory Visit | Attending: Cardiology | Admitting: Cardiology

## 2016-09-24 DIAGNOSIS — R0602 Shortness of breath: Secondary | ICD-10-CM | POA: Insufficient documentation

## 2016-09-24 DIAGNOSIS — I2510 Atherosclerotic heart disease of native coronary artery without angina pectoris: Secondary | ICD-10-CM | POA: Diagnosis not present

## 2016-09-24 LAB — MYOCARDIAL PERFUSION IMAGING
CHL CUP NUCLEAR SRS: 6
CHL CUP NUCLEAR SSS: 6
LV dias vol: 61 mL (ref 46–106)
LVSYSVOL: 25 mL
Peak HR: 111 {beats}/min
Rest HR: 81 {beats}/min
SDS: 1
TID: 0.99

## 2016-09-24 MED ORDER — TECHNETIUM TC 99M TETROFOSMIN IV KIT
10.7000 | PACK | Freq: Once | INTRAVENOUS | Status: AC | PRN
  Administered 2016-09-24: 10.7 via INTRAVENOUS
  Filled 2016-09-24: qty 11

## 2016-09-24 MED ORDER — REGADENOSON 0.4 MG/5ML IV SOLN
0.4000 mg | Freq: Once | INTRAVENOUS | Status: AC
  Administered 2016-09-24: 0.4 mg via INTRAVENOUS

## 2016-09-24 MED ORDER — AMINOPHYLLINE 25 MG/ML IV SOLN
75.0000 mg | Freq: Once | INTRAVENOUS | Status: AC
  Administered 2016-09-24: 75 mg via INTRAVENOUS

## 2016-09-28 ENCOUNTER — Ambulatory Visit (HOSPITAL_COMMUNITY): Payer: PPO | Attending: Cardiology

## 2016-09-28 ENCOUNTER — Other Ambulatory Visit: Payer: Self-pay

## 2016-09-28 DIAGNOSIS — I2510 Atherosclerotic heart disease of native coronary artery without angina pectoris: Secondary | ICD-10-CM | POA: Diagnosis not present

## 2016-09-28 DIAGNOSIS — R0602 Shortness of breath: Secondary | ICD-10-CM | POA: Diagnosis not present

## 2016-10-05 ENCOUNTER — Encounter: Payer: Self-pay | Admitting: Cardiovascular Disease

## 2016-10-05 ENCOUNTER — Ambulatory Visit (INDEPENDENT_AMBULATORY_CARE_PROVIDER_SITE_OTHER): Payer: PPO | Admitting: Cardiovascular Disease

## 2016-10-05 VITALS — BP 120/82 | HR 86 | Ht 66.0 in | Wt 221.2 lb

## 2016-10-05 DIAGNOSIS — E7800 Pure hypercholesterolemia, unspecified: Secondary | ICD-10-CM

## 2016-10-05 DIAGNOSIS — I2510 Atherosclerotic heart disease of native coronary artery without angina pectoris: Secondary | ICD-10-CM | POA: Diagnosis not present

## 2016-10-05 DIAGNOSIS — R030 Elevated blood-pressure reading, without diagnosis of hypertension: Secondary | ICD-10-CM

## 2016-10-05 DIAGNOSIS — D751 Secondary polycythemia: Secondary | ICD-10-CM | POA: Diagnosis not present

## 2016-10-05 DIAGNOSIS — I2584 Coronary atherosclerosis due to calcified coronary lesion: Secondary | ICD-10-CM

## 2016-10-05 DIAGNOSIS — I5030 Unspecified diastolic (congestive) heart failure: Secondary | ICD-10-CM

## 2016-10-05 DIAGNOSIS — I700 Atherosclerosis of aorta: Secondary | ICD-10-CM

## 2016-10-05 DIAGNOSIS — Z720 Tobacco use: Secondary | ICD-10-CM

## 2016-10-05 NOTE — Progress Notes (Signed)
Cardiology Consultation Note    Date:  10/07/2016   ID:  Meghan Griffith, DOB [birth year], MRN [id number]  PCP:  Roger KillBreejante J Williams, PA-C  Cardiologist:   Thurmon FairMihai Sitlaly Gudiel, MD  Referred by: Rueben BashBreejante Williams, [facility] Reason for consultation: Dyspnea  Chief Complaint  Patient presents with  . Follow-up    follow up from all testing     History of Present Illness:  Meghan Griffith is a 70 y.o. female referred in consultation for shortness of breath, after undergoing a nuclear stress test and echo.  No evidence of coronary insufficiency or valvular problems was identified, but there are signs of diastolic dysfunction, maybe HTN related. EF is 60%. She has improved with diuretics.  She has quit smoking and is worried about weight gain.  CT angiogram of the chest did not show pulmonary embolism in September 2017, but it did show "considerable coronary artery calcification" and aortic atherosclerosis. She had venous ablation for varicose veins by Dr. Tawanna Coolerodd early in August 2017.  In 2013, had a report of normal PFTs. She had a normal Lexiscan Myoview with EF 68%.      Past Medical History:  Diagnosis Date  . Arthritis   . Cataracts, bilateral   . Cholelithiasis   . Chronic headache   . Incontinence   . Lower leg edema   . Phlebitis   . PVC (premature ventricular contraction)   . SOB (shortness of breath)   . Varicose veins   . Venous insufficiency (chronic) (peripheral)     Past Surgical History:  Procedure Laterality Date  . CHOLECYSTECTOMY    . collagen     injections  . ENDOVENOUS ABLATION SAPHENOUS VEIN W/ LASER Right 02-28-2014   right greater saphenous vein and stab phlebectomy 10-20 incisions right leg  by Gretta Beganodd Early MD  . ENDOVENOUS ABLATION SAPHENOUS VEIN W/ LASER Left 07-18-2014   EVLA left greater saphenous vein, stab phlebectomy 10-20 incisions left leg , and sclerotherapy left leg    . ENDOVENOUS ABLATION SAPHENOUS VEIN W/ LASER Left 08-01-2014   EVLA  left small saphenous vein  by Gretta Beganodd Early MD  . HERNIA REPAIR    . INCISIONAL HERNIA REPAIR N/A 10/26/2013   Procedure: LAPAROSCOPIC INCISIONAL HERNIA;  Surgeon: Atilano InaEric M Wilson, MD;  Location: [facility];  Service: General;  Laterality: N/A;  . INSERTION OF MESH N/A 10/26/2013   Procedure: INSERTION OF MESH;  Surgeon: Atilano InaEric M Wilson, MD;  Location: [facility];  Service: General;  Laterality: N/A;  . KNEE ARTHROSCOPY  2002   left knee  . LAPAROSCOPIC CHOLECYSTECTOMY W/ CHOLANGIOGRAPHY  07/30/11  . NOSE SURGERY    . TUBAL LIGATION  1976  . vaginal sling      Current Medications: Outpatient Medications Prior to Visit  Medication Sig Dispense Refill  . albuterol (PROVENTIL HFA;VENTOLIN HFA) 108 (90 Base) MCG/ACT inhaler Inhale 1-2 puffs into the lungs every 6 (six) hours as needed for wheezing or shortness of breath.    Marland Kitchen. aspirin EC 81 MG tablet Take 81 mg by mouth as needed.    Marland Kitchen. atorvastatin (LIPITOR) 40 MG tablet Take 1 tablet (40 mg total) by mouth daily. 30 tablet 11  . furosemide (LASIX) 40 MG tablet Take 1 tablet (40 mg total) by mouth 3 (three) times a week. Mondays, Wednesdays, and Fridays 15 tablet 11   No facility-administered medications prior to visit.      Allergies:   Erythromycin base; Lansoprazole; Prednisone; and Tetracyclines & related   Social  History   Social History  . Marital status: Single    Spouse name: N/A  . Number of children: 2  . Years of education: N/A   Occupational History  . textiles [employer]   Social History Main Topics  . Smoking status: Former Smoker    Packs/day: 1.00    Years: 28.00    Quit date: 09/20/2013  . Smokeless tobacco: Never Used  . Alcohol use No  . Drug use: No  . Sexual activity: Not Asked   Other Topics Concern  . None   Social History Narrative   Pt is single.     Family History:  The patient's family history includes COPD in her father, mother, and sister; Cancer in her sister; Diabetes in her brother and mother;  Heart disease in her brother, father, mother, and sister; Hypertension in her brother, father, mother, and sister; Other in her mother; Rheum arthritis in her mother.   ROS:   Please see the history of present illness.    ROS All other systems reviewed and are negative.   PHYSICAL EXAM:   VS:  BP 120/82 (BP Location: Left Arm, Patient Position: Sitting, Cuff Size: Normal)   Pulse 86   Ht 5\' 6"  (1.676 m)   Wt 221 lb 4 oz (100.4 kg)   SpO2 99%   BMI 35.71 kg/m    GEN: Well nourished, well developed, in no acute distress  HEENT: normal  Neck: no JVD, carotid bruits, or masses Cardiac: RRR; no murmurs, rubs, or gallops,no edema  Respiratory:  clear to auscultation bilaterally, normal work of breathing GI: soft, nontender, nondistended, + BS MS: no deformity or atrophy  Skin: warm and dry, no rash Neuro:  Alert and Oriented x 3, Strength and sensation are intact Psych: euthymic mood, full affect  Wt Readings from Last 3 Encounters:  10/05/16 221 lb 4 oz (100.4 kg)  09/24/16 209 lb (94.8 kg)  09/15/16 209 lb 12.8 oz (95.2 kg)      Studies/Labs Reviewed:   EKG:  EKG is ordered today.  The ekg ordered today demonstrates Sinus rhythm, isolated tiny Q waves and small T-wave inversion in lead III, otherwise normal  Recent Labs: See HPI    Component Value Date/Time   CHOL 213 (H) 09/15/2016 1147   TRIG 61 09/15/2016 1147   HDL 49 09/15/2016 1147   CHOLHDL 4.3 09/15/2016 1147   VLDL 12 09/15/2016 1147   LDLCALC 152 (H) 09/15/2016 1147       ASSESSMENT:    1. Coronary artery calcification   2. Diastolic congestive heart failure, NYHA class 2, unspecified congestive heart failure chronicity (HCC)   3. Situational hypertension   4. Polycythemia   5. Tobacco abuse   6. Aortic atherosclerosis (HCC)   7. Hypercholesterolemia      PLAN:  In order of problems listed above:  1. Coronary calcification: Has a normal nuclear stress test. She does not have angina pectoris.  Treat risk factors. 2. CHF: Dyspnea is probably a combination of mild diastolic HF and smoking related COPD. BP is normal. Requires very low dose diuretic. Deconditioning is also probably part of the problem. 3. Situational HTN: Blood pressure is great today. 4. Polycythemia: I think this is related to chronic hypoxia. Recheck in a few months after complete smoking cessation. 5. Tobacco abuse: congratulated on not smoking 6. Aortic atherosclerosis: identified on CT. 7. HLP: Target LDL less than 100, preferably less than 70. Statin has been started, recheck in a  few months.    Medication Adjustments/Labs and Tests Ordered: Current medicines are reviewed at length with the patient today.  Concerns regarding medicines are outlined above.  Medication changes, Labs and Tests ordered today are listed in the Patient Instructions below. Patient Instructions  Dr Royann Shivers recommends that you schedule a follow-up appointment in 6 months. You will receive a reminder letter in the mail two months in advance. If you don't receive a letter, please call our office to schedule the follow-up appointment.  If you need a refill on your cardiac medications before your next appointment, please call your pharmacy.    Signed, Thurmon Fair, MD  10/07/2016 6:27 PM    [facility] [address] [place], [place], [place]  [address] Phone: [phone]; Fax: [phone]

## 2016-10-05 NOTE — Patient Instructions (Signed)
Dr Croitoru recommends that you schedule a follow-up appointment in 6 months. You will receive a reminder letter in the mail two months in advance. If you don't receive a letter, please call our office to schedule the follow-up appointment.  If you need a refill on your cardiac medications before your next appointment, please call your pharmacy. 

## 2016-10-07 ENCOUNTER — Encounter: Payer: Self-pay | Admitting: Cardiovascular Disease

## 2016-10-07 DIAGNOSIS — E7800 Pure hypercholesterolemia, unspecified: Secondary | ICD-10-CM | POA: Insufficient documentation

## 2016-10-07 DIAGNOSIS — I700 Atherosclerosis of aorta: Secondary | ICD-10-CM | POA: Insufficient documentation

## 2016-10-22 IMAGING — NM NM MISC PROCEDURE
6 series · 36 of 36 positions shown · non-contrast
Comparison: none

[Series 1: wbr_r-proj_st wbr rest · 6.40mm/px · 6 of 64 frames shown]
[frame 6/64]
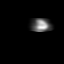
[frame 16/64]
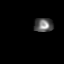
[frame 27/64]
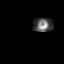
[frame 38/64]
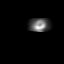
[frame 48/64]
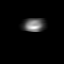
[frame 59/64]
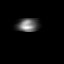

[Series 1: wbr rest · 6.40mm/px · 6 of 64 frames shown]
[frame 6/64]
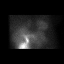
[frame 16/64]
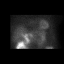
[frame 27/64]
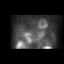
[frame 38/64]
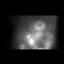
[frame 48/64]
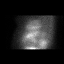
[frame 59/64]
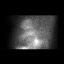

[Series 2: wbr_s-proj_st wbr stress-gsp · 6.40mm/px · 6 of 512 frames shown]
[frame 43/512]
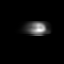
[frame 128/512]
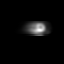
[frame 214/512]
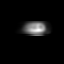
[frame 299/512]
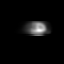
[frame 384/512]
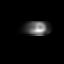
[frame 470/512]
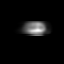

[Series 2: wbr stress-gsp · 6.40mm/px · 6 of 502 frames shown]
[frame 42/502  full-range]
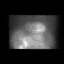
[frame 126/502  full-range]
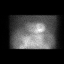
[frame 210/502  full-range]
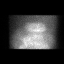
[frame 293/502  full-range]
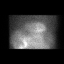
[frame 377/502  full-range]
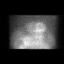
[frame 461/502  full-range]
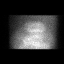

[Series 3: wbr stress-sum-em · 6.40mm/px · 6 of 64 frames shown]
[frame 6/64]
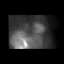
[frame 16/64]
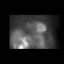
[frame 27/64]
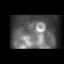
[frame 38/64]
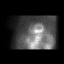
[frame 48/64]
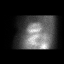
[frame 59/64]
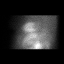

[Series 3: wbr_s-proj_st wbr stress-sum-em · 6.40mm/px · 6 of 64 frames shown]
[frame 6/64]
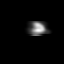
[frame 16/64]
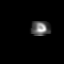
[frame 27/64]
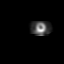
[frame 38/64]
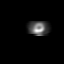
[frame 48/64]
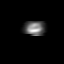
[frame 59/64]
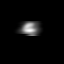

[36 of 36 positions shown; findings below may reference images not displayed]

Canned report from images found in remote index.

Refer to host system for actual result text.

## 2016-12-28 DIAGNOSIS — Z79899 Other long term (current) drug therapy: Secondary | ICD-10-CM | POA: Diagnosis not present

## 2016-12-28 DIAGNOSIS — E785 Hyperlipidemia, unspecified: Secondary | ICD-10-CM | POA: Diagnosis not present

## 2016-12-29 LAB — LIPID PANEL
CHOLESTEROL: 124 mg/dL (ref <200)
HDL: 51 mg/dL (ref >50)
LDL Cholesterol: 62 mg/dL (ref <100)
Total CHOL/HDL Ratio: 2.4 Ratio (ref <5.0)
Triglycerides: 55 mg/dL (ref <150)
VLDL: 11 mg/dL (ref <30)

## 2017-01-07 ENCOUNTER — Telehealth: Payer: Self-pay | Admitting: Cardiovascular Disease

## 2017-01-07 NOTE — Telephone Encounter (Signed)
Advised pt results at target per provider input - continue current medical therapy.  She requested a return 21mo OV for April, and this was scheduled & confirmed. Aware to call again if needed.

## 2017-01-07 NOTE — Telephone Encounter (Signed)
New message ° ° ° ° ° ° °Calling to get lab results °

## 2017-03-17 ENCOUNTER — Ambulatory Visit (INDEPENDENT_AMBULATORY_CARE_PROVIDER_SITE_OTHER): Payer: PPO | Admitting: Cardiovascular Disease

## 2017-03-17 ENCOUNTER — Encounter: Payer: Self-pay | Admitting: Cardiovascular Disease

## 2017-03-17 VITALS — BP 108/80 | HR 75 | Ht 66.0 in | Wt 231.0 lb

## 2017-03-17 DIAGNOSIS — E785 Hyperlipidemia, unspecified: Secondary | ICD-10-CM | POA: Diagnosis not present

## 2017-03-17 DIAGNOSIS — I2584 Coronary atherosclerosis due to calcified coronary lesion: Secondary | ICD-10-CM | POA: Diagnosis not present

## 2017-03-17 DIAGNOSIS — R0609 Other forms of dyspnea: Secondary | ICD-10-CM | POA: Diagnosis not present

## 2017-03-17 DIAGNOSIS — I700 Atherosclerosis of aorta: Secondary | ICD-10-CM

## 2017-03-17 DIAGNOSIS — I2510 Atherosclerotic heart disease of native coronary artery without angina pectoris: Secondary | ICD-10-CM

## 2017-03-17 DIAGNOSIS — D751 Secondary polycythemia: Secondary | ICD-10-CM | POA: Diagnosis not present

## 2017-03-17 DIAGNOSIS — Z79899 Other long term (current) drug therapy: Secondary | ICD-10-CM

## 2017-03-17 MED ORDER — ALBUTEROL SULFATE HFA 108 (90 BASE) MCG/ACT IN AERS: 1.0000 | INHALATION_SPRAY | Freq: Four times a day (QID) | RESPIRATORY_TRACT | 11 refills | Status: DC | PRN

## 2017-03-17 NOTE — Progress Notes (Signed)
Cardiology Office Note    Date:  03/17/2017   ID:  PHELICIA DANTES, DOB 1946-10-23, MRN [id number]  PCP:  Roger Kill, PA-C  Cardiologist:   Thurmon Fair, MD  Referred by: [name], [facility] Reason for consultation: Dyspnea  Chief Complaint  Patient presents with  . Follow-up    pt c/o SOB and swelling in feet and legs    History of Present Illness:  Meghan Griffith is a 71 y.o. female with extensive coronary calcifications on chest CT but with normal left ventricular systolic function, normal myocardial perfusion pattern, without angina. She successfully quit smoking 6 months ago, been moans the fact that she has gained weight since) 10 pounds).  Shortness of breath improved with treatment with diuretics in relatively low dose. She does not describe clear-cut orthopnea. It seems the weather has more to do with her shortness of breath. She does have some improvement with albuterol. Occasionally she has swelling of the dorsum of her foot, especially if she eats salty foods such as pizza.   Her echo was interpreted as showing grade 1 diastolic dysfunction, but I think it was normal. Her lateral and medial diastolic annular velocities were 12 and 10 cm/second respectively, normal for her age.  Past Medical History:  Diagnosis Date  . Arthritis   . Cataracts, bilateral   . Cholelithiasis   . Chronic headache   . Incontinence   . Lower leg edema   . Phlebitis   . PVC (premature ventricular contraction)   . SOB (shortness of breath)   . Varicose veins   . Venous insufficiency (chronic) (peripheral)     Past Surgical History:  Procedure Laterality Date  . CHOLECYSTECTOMY    . collagen     injections  . ENDOVENOUS ABLATION SAPHENOUS VEIN W/ LASER Right 02-28-2014   right greater saphenous vein and stab phlebectomy 10-20 incisions right leg  by Gretta Began MD  . ENDOVENOUS ABLATION SAPHENOUS VEIN W/ LASER Left 07-18-2014   EVLA left greater saphenous  vein, stab phlebectomy 10-20 incisions left leg , and sclerotherapy left leg    . ENDOVENOUS ABLATION SAPHENOUS VEIN W/ LASER Left 08-01-2014   EVLA  left small saphenous vein  by Gretta Began MD  . HERNIA REPAIR    . INCISIONAL HERNIA REPAIR N/A 10/26/2013   Procedure: LAPAROSCOPIC INCISIONAL HERNIA;  Surgeon: Atilano Ina, MD;  Location: [facility];  Service: General;  Laterality: N/A;  . INSERTION OF MESH N/A 10/26/2013   Procedure: INSERTION OF MESH;  Surgeon: Atilano Ina, MD;  Location: [facility];  Service: General;  Laterality: N/A;  . KNEE ARTHROSCOPY  2002   left knee  . LAPAROSCOPIC CHOLECYSTECTOMY W/ CHOLANGIOGRAPHY  07/30/11  . NOSE SURGERY    . TUBAL LIGATION  1976  . vaginal sling      Current Medications: Outpatient Medications Prior to Visit  Medication Sig Dispense Refill  . aspirin EC 81 MG tablet Take 81 mg by mouth as needed.    Marland Kitchen atorvastatin (LIPITOR) 40 MG tablet Take 1 tablet (40 mg total) by mouth daily. 30 tablet 11  . furosemide (LASIX) 40 MG tablet Take 1 tablet (40 mg total) by mouth 3 (three) times a week. Mondays, Wednesdays, and Fridays 15 tablet 11  . albuterol (PROVENTIL HFA;VENTOLIN HFA) 108 (90 Base) MCG/ACT inhaler Inhale 1-2 puffs into the lungs every 6 (six) hours as needed for wheezing or shortness of breath.     No facility-administered medications prior to  visit.      Allergies:   Erythromycin base; Lansoprazole; Prednisone; and Tetracyclines & related   Social History   Social History  . Marital status: Single    Spouse name: N/A  . Number of children: 2  . Years of education: N/A   Occupational History  . textiles [employer]   Social History Main Topics  . Smoking status: Former Smoker    Packs/day: 1.00    Years: 28.00    Quit date: 09/20/2013  . Smokeless tobacco: Never Used  . Alcohol use No  . Drug use: No  . Sexual activity: Not Asked   Other Topics Concern  . None   Social History Narrative   Pt is single.     Family  History:  The patient's family history includes COPD in her father, mother, and sister; Cancer in her sister; Diabetes in her brother and mother; Heart disease in her brother, father, mother, and sister; Hypertension in her brother, father, mother, and sister; Other in her mother; Rheum arthritis in her mother.   ROS:   Please see the history of present illness.    ROS All other systems reviewed and are negative.   PHYSICAL EXAM:   VS:  BP 108/80   Pulse 75   Ht  (1.676 m)   Wt 104.8 kg (231 lb)   BMI 37.28 kg/m    GEN: Well nourished, well developed, in no acute distress  HEENT: normal  Neck: no JVD, carotid bruits, or masses Cardiac: RRR; no murmurs, rubs, or gallops,no edema  Respiratory:  clear to auscultation bilaterally, normal work of breathing GI: soft, nontender, nondistended, + BS MS: no deformity or atrophy  Skin: warm and dry, no rash Neuro:  Alert and Oriented x 3, Strength and sensation are intact Psych: euthymic mood, full affect  Wt Readings from Last 3 Encounters:  03/17/17 104.8 kg (231 lb)  10/05/16 100.4 kg (221 lb 4 oz)  09/24/16 94.8 kg (209 lb)      Studies/Labs Reviewed:   EKG:  EKG is ordered today.  The ekg ordered today demonstrates Sinus rhythm, isolated tiny Q waves and small T-wave inversion in lead III, otherwise normal  Recent Labs: See HPI    Component Value Date/Time   CHOL 124 12/28/2016 1047   TRIG 55 12/28/2016 1047   HDL 51 12/28/2016 1047   CHOLHDL 2.4 12/28/2016 1047   VLDL 11 12/28/2016 1047   LDLCALC 62 12/28/2016 1047       ASSESSMENT:    1. Coronary artery calcification   2. DOE (dyspnea on exertion)   3. Polycythemia   4. Medication management   5. Aortic atherosclerosis (HCC)   6. Dyslipidemia      PLAN:  In order of problems listed above:  1. Coronary calcification: Has a normal nuclear stress test. She does not have angina pectoris. Treat risk factors. 2. CHF: Dyspnea is probably a combination of  mild diastolic HF and smoking related COPD. BP is normal. Requires very low dose diuretic. Deconditioning and obesity are also probably part of the problem. 3. Polycythemia: I think this is related to chronic hypoxia. Recheck time we draw lipid profile 4. Tobacco abuse: congratulated on not smoking, which would be in her favor even if she has gained weight 5. Aortic atherosclerosis: identified on CT. 6. HLP: Target LDL less than 100, preferably less than 70. Improvement after starting statin with LDL of 62 in January.    Medication Adjustments/Labs and Tests Ordered:  Current medicines are reviewed at length with the patient today.  Concerns regarding medicines are outlined above.  Medication changes, Labs and Tests ordered today are listed in the Patient Instructions below. Patient Instructions  Dr Royann Shivers recommends that you schedule a follow-up appointment in 12 months. You will receive a reminder letter in the mail two months in advance. If you don't receive a letter, please call our office to schedule the follow-up appointment.  If you need a refill on your cardiac medications before your next appointment, please call your pharmacy.  Your physician recommends that you return for lab work in 12 months. You will receive lab slips in the mail closer to that time frame. Please have them done prior to your office visit with Dr C.    Signed, Thurmon Fair, MD  03/17/2017 1:54 PM    [facility] [address] [place], [place], [place]  [address] Phone: [phone]; Fax: [phone]

## 2017-03-17 NOTE — Patient Instructions (Signed)
Dr Royann Shivers recommends that you schedule a follow-up appointment in 12 months. You will receive a reminder letter in the mail two months in advance. If you don't receive a letter, please call our office to schedule the follow-up appointment.  If you need a refill on your cardiac medications before your next appointment, please call your pharmacy.  Your physician recommends that you return for lab work in 12 months. You will receive lab slips in the mail closer to that time frame. Please have them done prior to your office visit with Dr C.

## 2017-08-25 ENCOUNTER — Other Ambulatory Visit: Payer: Self-pay | Admitting: Cardiovascular Disease

## 2017-12-08 ENCOUNTER — Telehealth: Payer: Self-pay | Admitting: Cardiovascular Disease

## 2017-12-08 DIAGNOSIS — Z79899 Other long term (current) drug therapy: Secondary | ICD-10-CM

## 2017-12-08 DIAGNOSIS — D751 Secondary polycythemia: Secondary | ICD-10-CM

## 2017-12-08 DIAGNOSIS — E785 Hyperlipidemia, unspecified: Secondary | ICD-10-CM

## 2017-12-08 NOTE — Telephone Encounter (Signed)
Pt wanted to make sure her lab order is in ,so she can get her lab work before her 03-17-18 appt.

## 2017-12-08 NOTE — Telephone Encounter (Signed)
SPOKE TO PATIENT . PATIENT STATES SHE WILL BE GETTING LABS NEXT WEEK.  INFORMED PATIENT WILL RELEASE LABS FROM THE COMPUTER - SO THAT SHE CAN HAVE LAB WORK IN THE SYSTEM.   PATIENT PREFER [facility].

## 2017-12-08 NOTE — Telephone Encounter (Signed)
LEFT MESSAGE TO CAL BACK  LABS ARE IN COMPUTER FOR "FUTURE" PATIENT WILL HAVE CNTACT US WHEN SHE IS GOING TO LAB SO THE LABS CAN BE RELEASED.

## 2017-12-21 DIAGNOSIS — D751 Secondary polycythemia: Secondary | ICD-10-CM | POA: Diagnosis not present

## 2017-12-22 LAB — CBC
HCT: 46 % — ABNORMAL HIGH (ref 35.0–45.0)
Hemoglobin: 15.1 g/dL (ref 11.7–15.5)
MCH: 28.1 pg (ref 27.0–33.0)
MCHC: 32.8 g/dL (ref 32.0–36.0)
MCV: 85.5 fL (ref 80.0–100.0)
MPV: 13.7 fL — ABNORMAL HIGH (ref 7.5–12.5)
PLATELETS: 126 10*3/uL — AB (ref 140–400)
RBC: 5.38 10*6/uL — ABNORMAL HIGH (ref 3.80–5.10)
RDW: 13 % (ref 11.0–15.0)
WBC: 4.2 10*3/uL (ref 3.8–10.8)

## 2017-12-22 LAB — COMPREHENSIVE METABOLIC PANEL
AG Ratio: 1.6 (calc) (ref 1.0–2.5)
ALT: 20 U/L (ref 6–29)
AST: 23 U/L (ref 10–35)
Albumin: 4.4 g/dL (ref 3.6–5.1)
Alkaline phosphatase (APISO): 129 U/L (ref 33–130)
BUN/Creatinine Ratio: 12 (calc) (ref 6–22)
BUN: 12 mg/dL (ref 7–25)
CO2: 27 mmol/L (ref 20–32)
CREATININE: 1.01 mg/dL — AB (ref 0.50–0.99)
Calcium: 9.6 mg/dL (ref 8.6–10.4)
Chloride: 105 mmol/L (ref 98–110)
GLUCOSE: 104 mg/dL — AB (ref 65–99)
Globulin: 2.7 g/dL (calc) (ref 1.9–3.7)
Potassium: 4.2 mmol/L (ref 3.5–5.3)
SODIUM: 141 mmol/L (ref 135–146)
TOTAL PROTEIN: 7.1 g/dL (ref 6.1–8.1)
Total Bilirubin: 0.9 mg/dL (ref 0.2–1.2)

## 2017-12-22 LAB — LIPID PANEL
CHOL/HDL RATIO: 2.9 (calc) (ref <5.0)
Cholesterol: 144 mg/dL (ref <200)
HDL: 49 mg/dL — ABNORMAL LOW (ref >50)
LDL CHOLESTEROL (CALC): 79 mg/dL
Non-HDL Cholesterol (Calc): 95 mg/dL (calc) (ref <130)
Triglycerides: 80 mg/dL (ref <150)

## 2018-03-09 ENCOUNTER — Telehealth: Payer: Self-pay | Admitting: Cardiovascular Disease

## 2018-03-09 NOTE — Telephone Encounter (Signed)
Closed Encounter  °

## 2018-03-17 ENCOUNTER — Ambulatory Visit: Payer: PPO | Admitting: Cardiovascular Disease

## 2018-03-20 ENCOUNTER — Encounter: Payer: Self-pay | Admitting: *Deleted

## 2018-03-27 ENCOUNTER — Other Ambulatory Visit: Payer: Self-pay | Admitting: Cardiovascular Disease

## 2018-03-27 NOTE — Telephone Encounter (Signed)
Rx(s) sent to pharmacy electronically.  

## 2018-04-05 ENCOUNTER — Ambulatory Visit: Payer: PPO | Admitting: Cardiovascular Disease

## 2018-05-04 ENCOUNTER — Encounter: Payer: Self-pay | Admitting: Cardiovascular Disease

## 2018-05-04 ENCOUNTER — Ambulatory Visit (INDEPENDENT_AMBULATORY_CARE_PROVIDER_SITE_OTHER): Payer: PPO | Admitting: Cardiovascular Disease

## 2018-05-04 VITALS — BP 122/82 | HR 80 | Ht 66.0 in | Wt 237.0 lb

## 2018-05-04 DIAGNOSIS — I700 Atherosclerosis of aorta: Secondary | ICD-10-CM

## 2018-05-04 DIAGNOSIS — I2510 Atherosclerotic heart disease of native coronary artery without angina pectoris: Secondary | ICD-10-CM

## 2018-05-04 DIAGNOSIS — I2584 Coronary atherosclerosis due to calcified coronary lesion: Secondary | ICD-10-CM | POA: Diagnosis not present

## 2018-05-04 DIAGNOSIS — I50812 Chronic right heart failure: Secondary | ICD-10-CM

## 2018-05-04 DIAGNOSIS — Z79899 Other long term (current) drug therapy: Secondary | ICD-10-CM | POA: Diagnosis not present

## 2018-05-04 DIAGNOSIS — E7800 Pure hypercholesterolemia, unspecified: Secondary | ICD-10-CM | POA: Diagnosis not present

## 2018-05-04 DIAGNOSIS — R0609 Other forms of dyspnea: Secondary | ICD-10-CM

## 2018-05-04 NOTE — Progress Notes (Signed)
Cardiology Office Note    Date:  05/06/2018   ID:  Meghan Griffith, DOB [birth year], MRN [id number]  PCP:  Roger Kill, PA-C  Cardiologist:   Thurmon Fair, MD  Referred by: [name], [facility]  Chief Complaint  Patient presents with  . Joint Swelling    History of Present Illness:  Meghan Griffith is a 72 y.o. female with extensive coronary calcifications on chest CT but with normal left ventricular systolic function, normal myocardial perfusion pattern, without angina.   Generally done well and denies problems with shortness of breath, as long as she takes her diuretic.  She usually takes it a few days a week, not daily.  She has had problems with diarrhea as well and takes loperamide.  She avoids taking the diuretic every day "because she would then be spending her whole life from the restroom".  She has not had orthopnea and PND.  Is always worse at the end of the day and often goes away by the next morning.   Her echo was interpreted as showing grade 1 diastolic dysfunction, but I think it was normal. Her lateral and medial diastolic annular velocities were 12 and 10 cm/second respectively, normal for her age.  Past Medical History:  Diagnosis Date  . Arthritis   . Cataracts, bilateral   . Cholelithiasis   . Chronic headache   . Incontinence   . Lower leg edema   . Phlebitis   . PVC (premature ventricular contraction)   . SOB (shortness of breath)   . Varicose veins   . Venous insufficiency (chronic) (peripheral)     Past Surgical History:  Procedure Laterality Date  . CHOLECYSTECTOMY    . collagen     injections  . ENDOVENOUS ABLATION SAPHENOUS VEIN W/ LASER Right 02-28-2014   right greater saphenous vein and stab phlebectomy 10-20 incisions right leg  by Gretta Began MD  . ENDOVENOUS ABLATION SAPHENOUS VEIN W/ LASER Left 07-18-2014   EVLA left greater saphenous vein, stab phlebectomy 10-20 incisions left leg , and sclerotherapy left leg      . ENDOVENOUS ABLATION SAPHENOUS VEIN W/ LASER Left 08-01-2014   EVLA  left small saphenous vein  by Gretta Began MD  . HERNIA REPAIR    . INCISIONAL HERNIA REPAIR N/A 10/26/2013   Procedure: LAPAROSCOPIC INCISIONAL HERNIA;  Surgeon: Atilano Ina, MD;  Location: [facility];  Service: General;  Laterality: N/A;  . INSERTION OF MESH N/A 10/26/2013   Procedure: INSERTION OF MESH;  Surgeon: Atilano Ina, MD;  Location: [facility];  Service: General;  Laterality: N/A;  . KNEE ARTHROSCOPY  2002   left knee  . LAPAROSCOPIC CHOLECYSTECTOMY W/ CHOLANGIOGRAPHY  07/30/11  . NOSE SURGERY    . TUBAL LIGATION  1976  . vaginal sling      Current Medications: Outpatient Medications Prior to Visit  Medication Sig Dispense Refill  . albuterol (PROVENTIL HFA;VENTOLIN HFA) 108 (90 Base) MCG/ACT inhaler Inhale 1-2 puffs into the lungs every 6 (six) hours as needed for wheezing or shortness of breath. 6.7 g 11  . aspirin EC 81 MG tablet Take 81 mg by mouth as needed.    Marland Kitchen atorvastatin (LIPITOR) 40 MG tablet Take 1 tablet (40 mg total) by mouth daily. MUST KEEP APPOINTMENT 05/04/18 WITH DR Braxten Memmer FOR FUTURE REFILLS 90 tablet 0  . furosemide (LASIX) 40 MG tablet Take 1 tablet (40 mg total) by mouth 3 (three) times a week. Mondays, Wednesdays, and Fridays. 45  tablet 0  . ibuprofen (ADVIL,MOTRIN) 200 MG tablet Take 200 mg by mouth every 6 (six) hours as needed.    . loperamide (IMODIUM) 2 MG capsule Take 2 mg by mouth as needed for diarrhea or loose stools.    . ranitidine (ZANTAC) 75 MG tablet Take 75 mg by mouth 2 (two) times daily.     No facility-administered medications prior to visit.      Allergies:   Erythromycin base; Lansoprazole; Prednisone; and Tetracyclines & related   Social History   Socioeconomic History  . Marital status: Single    Spouse name: Not on file  . Number of children: 2  . Years of education: Not on file  . Highest education level: Not on file  Occupational History  . Occupation:  Public affairs consultant: [employer]  Social Needs  . Financial resource strain: Not on file  . Food insecurity:    Worry: Not on file    Inability: Not on file  . Transportation needs:    Medical: Not on file    Non-medical: Not on file  Tobacco Use  . Smoking status: Former Smoker    Packs/day: 1.00    Years: 28.00    Pack years: 28.00    Last attempt to quit: 09/20/2013    Years since quitting: 4.6  . Smokeless tobacco: Never Used  Substance and Sexual Activity  . Alcohol use: No  . Drug use: No  . Sexual activity: Not on file  Lifestyle  . Physical activity:    Days per week: Not on file    Minutes per session: Not on file  . Stress: Not on file  Relationships  . Social connections:    Talks on phone: Not on file    Gets together: Not on file    Attends religious service: Not on file    Active member of club or organization: Not on file    Attends meetings of clubs or organizations: Not on file    Relationship status: Not on file  Other Topics Concern  . Not on file  Social History Narrative   Pt is single.     Family History:  The patient's family history includes COPD in her father, mother, and sister; Cancer in her sister; Coronary artery disease in her unknown relative; Diabetes in her brother and mother; Heart disease in her brother, father, mother, and sister; Hypertension in her brother, father, mother, sister, and unknown relative; Other in her mother; Rheum arthritis in her mother.   ROS:   Please see the history of present illness.    ROS All other systems reviewed and are negative.   PHYSICAL EXAM:   VS:  BP 122/82   Pulse 80   Ht  (1.676 m)   Wt 237 lb (107.5 kg)   BMI 38.25 kg/m     General: Alert, oriented x3, no distress, severely obese Head: no evidence of trauma, PERRL, EOMI, no exophtalmos or lid lag, no myxedema, no xanthelasma; normal ears, nose and oropharynx Neck: normal jugular venous pulsations and no hepatojugular reflux; brisk  carotid pulses without delay and no carotid bruits Chest: clear to auscultation, no signs of consolidation by percussion or palpation, normal fremitus, symmetrical and full respiratory excursions Cardiovascular: normal position and quality of the apical impulse, regular rhythm, normal first and second heart sounds, no murmurs, rubs or gallops Abdomen: no tenderness or distention, no masses by palpation, no abnormal pulsatility or arterial bruits, normal bowel sounds,  no hepatosplenomegaly Extremities: no clubbing, cyanosis; symmetrical 1-2+ pedal and ankle edema; 2+ radial, ulnar and brachial pulses bilaterally; 2+ right femoral, posterior tibial and dorsalis pedis pulses; 2+ left femoral, posterior tibial and dorsalis pedis pulses; no subclavian or femoral bruits Neurological: grossly nonfocal Psych: Normal mood and affect   Wt Readings from Last 3 Encounters:  05/04/18 237 lb (107.5 kg)  03/17/17 231 lb (104.8 kg)  10/05/16 221 lb 4 oz (100.4 kg)      Studies/Labs Reviewed:   EKG:  EKG is ordered today.  The ekg ordered today demonstrates sinus rhythm, right bundle branch block, QTC 493 ms.  A single PVC is seen  Recent Labs: See HPI    Component Value Date/Time   CHOL 144 12/21/2017 1020   TRIG 80 12/21/2017 1020   HDL 49 (L) 12/21/2017 1020   CHOLHDL 2.9 12/21/2017 1020   VLDL 11 12/28/2016 1047   LDLCALC 79 12/21/2017 1020    ASSESSMENT:    1. Hypercholesterolemia   2. Medication management   3. Coronary artery calcification   4. Aortic atherosclerosis (HCC)   5. DOE (dyspnea on exertion)   6. Chronic right-sided heart failure (HCC)      PLAN:  In order of problems listed above:  1. CHF: I suspect that for the most part she has right heart failure related to COPD and obesity, there is only questionable evidence of left ventricular diastolic dysfunction.  Discussed the relationship between sodium intake and the need for diuretic use.  With current intermittent  diuretic use she does not need potassium supplements. 2. Coronary calcification: She remains asymptomatic and had a relatively recent normal nuclear stress test.  The focus is on treating the coronary risk factors.  3. Aortic atherosclerosis: identified on CT. work-up for significant vascular obstruction has been negative. 4. HLP: Target LDL less than 100, preferably less than 70.  LDL is a little higher this year compared to last, but I do not think this warrants a change in her current dose of statin.  Reviewed healthy diet, exercise, importance of some weight loss. 5. Polycythemia: This is mild and most likely related to chronic hypoxia from previous smoking and lung disease.  Hemoglobin has improved after she quit smoking is only mildly elevated.    Medication Adjustments/Labs and Tests Ordered: Current medicines are reviewed at length with the patient today.  Concerns regarding medicines are outlined above.  Medication changes, Labs and Tests ordered today are listed in the Patient Instructions below. Patient Instructions  Dr Royann Shivers recommends that you continue on your current medications as directed. Please refer to the Current Medication list given to you today.  Your physician recommends that you return for lab work at your convenience.  Dr Royann Shivers recommends that you schedule a follow-up appointment in 12 months. You will receive a reminder letter in the mail two months in advance. If you don't receive a letter, please call our office to schedule the follow-up appointment.  If you need a refill on your cardiac medications before your next appointment, please call your pharmacy.    Signed, Thurmon Fair, MD  05/06/2018 12:00 PM    [facility] [address] [place], [place], [place]  [address] Phone: [phone]; Fax: [phone]

## 2018-05-04 NOTE — Patient Instructions (Signed)
Dr Croitoru recommends that you continue on your current medications as directed. Please refer to the Current Medication list given to you today.  Your physician recommends that you return for lab work at your convenience.  Dr Croitoru recommends that you schedule a follow-up appointment in 12 months. You will receive a reminder letter in the mail two months in advance. If you don't receive a letter, please call our office to schedule the follow-up appointment.  If you need a refill on your cardiac medications before your next appointment, please call your pharmacy. 

## 2018-05-06 ENCOUNTER — Encounter: Payer: Self-pay | Admitting: Cardiovascular Disease

## 2018-06-23 ENCOUNTER — Other Ambulatory Visit: Payer: Self-pay | Admitting: Cardiovascular Disease

## 2018-06-23 NOTE — Telephone Encounter (Signed)
Rx request sent to pharmacy.  

## 2018-09-17 ENCOUNTER — Other Ambulatory Visit: Payer: Self-pay | Admitting: Cardiovascular Disease

## 2018-09-19 ENCOUNTER — Other Ambulatory Visit: Payer: Self-pay | Admitting: Cardiovascular Disease

## 2019-03-12 ENCOUNTER — Other Ambulatory Visit: Payer: Self-pay | Admitting: Cardiovascular Disease

## 2019-03-12 MED ORDER — FUROSEMIDE 40 MG PO TABS: 40.0000 mg | ORAL_TABLET | ORAL | 0 refills | Status: DC

## 2019-03-12 MED ORDER — ATORVASTATIN CALCIUM 40 MG PO TABS: 40.0000 mg | ORAL_TABLET | Freq: Every day | ORAL | 0 refills | Status: DC

## 2019-03-12 NOTE — Telephone Encounter (Signed)
°*  STAT* If patient is at the pharmacy, call can be transferred to refill team.   1. Which medications need to be refilled? (please list name of each medication and dose if known)  atorvastatin (LIPITOR) 40 MG tablet furosemide (LASIX) 40 MG tablet  2. Which pharmacy/location (including street and city if local pharmacy) is medication to be sent to?  [id number] - [place], [place] - [address] [place] [address]  3.  Do they need a 30 day or 90 day supply? 90  Pt has an appt scheduled for 07/13/19

## 2019-06-04 ENCOUNTER — Other Ambulatory Visit: Payer: Self-pay | Admitting: Cardiovascular Disease

## 2019-06-15 DIAGNOSIS — E7800 Pure hypercholesterolemia, unspecified: Secondary | ICD-10-CM | POA: Diagnosis not present

## 2019-06-15 DIAGNOSIS — Z79899 Other long term (current) drug therapy: Secondary | ICD-10-CM | POA: Diagnosis not present

## 2019-06-15 LAB — COMPREHENSIVE METABOLIC PANEL
A/G RATIO: 1.9 (ref 1.2–2.2)
ALK PHOS: 132 IU/L — AB (ref 39–117)
ALT: 19 IU/L (ref 0–32)
AST: 27 IU/L (ref 0–40)
Albumin: 4.6 g/dL (ref 3.8–4.8)
BUN/Creatinine Ratio: 16 (ref 12–28)
BUN: 13 mg/dL (ref 8–27)
Bilirubin Total: 0.8 mg/dL (ref 0.0–1.2)
CO2: 20 mmol/L (ref 20–29)
Calcium: 9.4 mg/dL (ref 8.7–10.3)
Chloride: 102 mmol/L (ref 96–106)
Creatinine, Ser: 0.83 mg/dL (ref 0.57–1.00)
GFR calc Af Amer: 83 mL/min/{1.73_m2} (ref >59)
GFR, EST NON AFRICAN AMERICAN: 72 mL/min/{1.73_m2} (ref >59)
Globulin, Total: 2.4 g/dL (ref 1.5–4.5)
Glucose: 93 mg/dL (ref 65–99)
Potassium: 4 mmol/L (ref 3.5–5.2)
SODIUM: 139 mmol/L (ref 134–144)
Total Protein: 7 g/dL (ref 6.0–8.5)

## 2019-06-15 LAB — LIPID PANEL
CHOL/HDL RATIO: 2.6 ratio (ref 0.0–4.4)
Cholesterol, Total: 139 mg/dL (ref 100–199)
HDL: 53 mg/dL (ref >39)
LDL CALC: 70 mg/dL (ref 0–99)
Triglycerides: 81 mg/dL (ref 0–149)
VLDL Cholesterol Cal: 16 mg/dL (ref 5–40)

## 2019-06-18 ENCOUNTER — Telehealth: Payer: Self-pay | Admitting: *Deleted

## 2019-06-18 NOTE — Telephone Encounter (Signed)
Left a message for the patient to call back.  

## 2019-06-18 NOTE — Telephone Encounter (Signed)
Patient made aware of results and verbalized understanding.  Repeat CMET will be ordered and her next office visit 07/13/2019

## 2019-06-18 NOTE — Telephone Encounter (Signed)
-----   Message from Sanda Klein, MD sent at 06/15/2019  5:11 PM EDT ----- Excellent cholesterol profile.  All other routine labs are normal except for very mild elevation in alkaline phosphatase (with normal calcium and normal other liver function tests).  Not sure of clinical significance, but recommend repeating liver function tests in 3 months

## 2019-07-13 ENCOUNTER — Ambulatory Visit (INDEPENDENT_AMBULATORY_CARE_PROVIDER_SITE_OTHER): Payer: PPO | Admitting: Cardiovascular Disease

## 2019-07-13 ENCOUNTER — Encounter: Payer: Self-pay | Admitting: Cardiovascular Disease

## 2019-07-13 ENCOUNTER — Other Ambulatory Visit: Payer: Self-pay

## 2019-07-13 VITALS — BP 141/72 | HR 70 | Temp 97.5°F | Ht 66.0 in | Wt 232.0 lb

## 2019-07-13 DIAGNOSIS — J449 Chronic obstructive pulmonary disease, unspecified: Secondary | ICD-10-CM | POA: Diagnosis not present

## 2019-07-13 DIAGNOSIS — I50812 Chronic right heart failure: Secondary | ICD-10-CM

## 2019-07-13 DIAGNOSIS — E7800 Pure hypercholesterolemia, unspecified: Secondary | ICD-10-CM

## 2019-07-13 DIAGNOSIS — I2584 Coronary atherosclerosis due to calcified coronary lesion: Secondary | ICD-10-CM

## 2019-07-13 DIAGNOSIS — I700 Atherosclerosis of aorta: Secondary | ICD-10-CM | POA: Diagnosis not present

## 2019-07-13 DIAGNOSIS — I2510 Atherosclerotic heart disease of native coronary artery without angina pectoris: Secondary | ICD-10-CM | POA: Diagnosis not present

## 2019-07-13 DIAGNOSIS — R748 Abnormal levels of other serum enzymes: Secondary | ICD-10-CM | POA: Diagnosis not present

## 2019-07-13 DIAGNOSIS — D751 Secondary polycythemia: Secondary | ICD-10-CM

## 2019-07-13 MED ORDER — ALBUTEROL SULFATE HFA 108 (90 BASE) MCG/ACT IN AERS: 1.0000 | INHALATION_SPRAY | Freq: Four times a day (QID) | RESPIRATORY_TRACT | 11 refills | Status: DC | PRN

## 2019-07-13 NOTE — Patient Instructions (Addendum)
Medication Instructions:  No changes If you need a refill on your cardiac medications before your next appointment, please call your pharmacy.  new Lab work: Your provider would like for you to return in the next month to have labs drawn: fasting labs. You do not need an appointment for the lab. Once in our office lobby there is a podium where you can sign in and ring the doorbell to alert [place] that you are here. The lab is open from 8:00 am to 4:30 pm; closed for lunch from 12:45pm-1:45pm.  If you have labs (blood work) drawn today and your tests are completely normal, you will receive your results only by: Marland Kitchen MyChart Message (if you have MyChart) OR . A paper copy in the mail If you have any lab test that is abnormal or we need to change your treatment, we will call you to review the results.  Testing/Procedures: None ordered  Follow-Up: At [facility], you and your health needs are our priority.  As part of our continuing mission to provide you with exceptional heart care, we have created designated Provider Care Teams.  These Care Teams include your primary Cardiologist (physician) and Advanced Practice Providers (APPs -  Physician Assistants and Nurse Practitioners) who all work together to provide you with the care you need, when you need it. You will need a follow up appointment in 12 months.  Please call our office 2 months in advance to schedule this appointment.  You may see Sanda Klein, MD or one of the following Advanced Practice Providers on your designated Care Team: [place], [place] . Fabian Sharp, PA-C

## 2019-07-13 NOTE — Progress Notes (Signed)
Cardiology Office Note    Date:  07/14/2019   ID:  Meghan Griffith, DOB 1946-08-21, MRN [id number]  PCP:  Heywood Bene, PA-C  Cardiologist:   Sanda Klein, MD  Referred by: Clyde Lundborg, [facility]  Chief Complaint  Patient presents with  . Edema    History of Present Illness:  Meghan Griffith is a 73 y.o. female with extensive coronary calcifications on chest CT but with normal left ventricular systolic function, normal myocardial perfusion pattern, without angina.   She is done generally quite well since her last appointment, but is anxious and upset about the coronavirus situation.  She feels isolated.  She continues to take the diuretic every other day, sometimes less than that.  She has mild pretibial edema but sometimes goes away completely, but is usually present to some degree every day.  She denies orthopnea, PND or exertional dyspnea but has been quite sedentary.  She has occasional palpitations.  She has not had chest pain, intermittent claudication, focal neurological events, syncope, falls or bleeding problems.  ECG shows PACs and blocked PACs.  Recent labs showed mild elevation of alk phos, with all other liver function tests being normal.  Creatinine and calcium level are also normal.  LDL cholesterol was 70 and all her other lipid parameters were excellent.  Her echo was interpreted as showing grade 1 diastolic dysfunction, but I think it was normal. Her lateral and medial diastolic annular velocities were 12 and 10 cm/second respectively, normal for her age.  Past Medical History:  Diagnosis Date  . Arthritis   . Cataracts, bilateral   . Cholelithiasis   . Chronic headache   . Incontinence   . Lower leg edema   . Phlebitis   . PVC (premature ventricular contraction)   . SOB (shortness of breath)   . Varicose veins   . Venous insufficiency (chronic) (peripheral)     Past Surgical History:  Procedure Laterality Date  . CHOLECYSTECTOMY     . collagen     injections  . ENDOVENOUS ABLATION SAPHENOUS VEIN W/ LASER Right 02-28-2014   right greater saphenous vein and stab phlebectomy 10-20 incisions right leg  by Curt Jews MD  . ENDOVENOUS ABLATION SAPHENOUS VEIN W/ LASER Left 07-18-2014   EVLA left greater saphenous vein, stab phlebectomy 10-20 incisions left leg , and sclerotherapy left leg    . ENDOVENOUS ABLATION SAPHENOUS VEIN W/ LASER Left 08-01-2014   EVLA  left small saphenous vein  by Curt Jews MD  . HERNIA REPAIR    . INCISIONAL HERNIA REPAIR N/A 10/26/2013   Procedure: LAPAROSCOPIC INCISIONAL HERNIA;  Surgeon: Gayland Curry, MD;  Location: [facility];  Service: General;  Laterality: N/A;  . INSERTION OF MESH N/A 10/26/2013   Procedure: INSERTION OF MESH;  Surgeon: Gayland Curry, MD;  Location: [facility];  Service: General;  Laterality: N/A;  . KNEE ARTHROSCOPY  2002   left knee  . LAPAROSCOPIC CHOLECYSTECTOMY W/ CHOLANGIOGRAPHY  07/30/11  . NOSE SURGERY    . TUBAL LIGATION  1976  . vaginal sling      Current Medications: Outpatient Medications Prior to Visit  Medication Sig Dispense Refill  . atorvastatin (LIPITOR) 40 MG tablet TAKE 1 TABLET BY MOUTH EVERY DAY AT 6PM 90 tablet 0  . Famotidine (PEPCID PO) Take by mouth.    . furosemide (LASIX) 40 MG tablet TAKE 1 TABLET BY MOUTH 3 TIMES A WEEK ON MON/WED/FRI 45 tablet 0  . albuterol (PROVENTIL HFA;VENTOLIN  HFA) 108 (90 Base) MCG/ACT inhaler Inhale 1-2 puffs into the lungs every 6 (six) hours as needed for wheezing or shortness of breath. 6.7 g 11  . aspirin EC 81 MG tablet Take 81 mg by mouth as needed.    Marland Kitchen ibuprofen (ADVIL,MOTRIN) 200 MG tablet Take 200 mg by mouth every 6 (six) hours as needed.    . loperamide (IMODIUM) 2 MG capsule Take 2 mg by mouth as needed for diarrhea or loose stools.    . ranitidine (ZANTAC) 75 MG tablet Take 75 mg by mouth 2 (two) times daily.     No facility-administered medications prior to visit.      Allergies:   Erythromycin base,  Lansoprazole, Prednisone, and Tetracyclines & related   Social History   Socioeconomic History  . Marital status: Single    Spouse name: Not on file  . Number of children: 2  . Years of education: Not on file  . Highest education level: Not on file  Occupational History  . Occupation: English as a second language teacher: [employer]  Social Needs  . Financial resource strain: Not on file  . Food insecurity    Worry: Not on file    Inability: Not on file  . Transportation needs    Medical: Not on file    Non-medical: Not on file  Tobacco Use  . Smoking status: Former Smoker    Packs/day: 1.00    Years: 28.00    Pack years: 28.00    Quit date: 09/20/2013    Years since quitting: 5.8  . Smokeless tobacco: Never Used  Substance and Sexual Activity  . Alcohol use: No  . Drug use: No  . Sexual activity: Not on file  Lifestyle  . Physical activity    Days per week: Not on file    Minutes per session: Not on file  . Stress: Not on file  Relationships  . Social Herbalist on phone: Not on file    Gets together: Not on file    Attends religious service: Not on file    Active member of club or organization: Not on file    Attends meetings of clubs or organizations: Not on file    Relationship status: Not on file  Other Topics Concern  . Not on file  Social History Narrative   Pt is single.     Family History:  The patient's family history includes COPD in her father, mother, and sister; Cancer in her sister; Coronary artery disease in her unknown relative; Diabetes in her brother and mother; Heart disease in her brother, father, mother, and sister; Hypertension in her brother, father, mother, sister, and unknown relative; Other in her mother; Rheum arthritis in her mother.   ROS:   Please see the history of present illness.    ROS all other systems are reviewed and are negative   PHYSICAL EXAM:   VS:  BP (!) 141/72   Pulse 70   Temp (!) 97.5 F (36.4 C)   Ht '5\' 6"'$  (1.676  m)   Wt 232 lb (105.2 kg)   SpO2 97%   BMI 37.45 kg/m       General: Alert, oriented x3, no distress, exam is limited somewhat by obesity Head: no evidence of trauma, PERRL, EOMI, no exophtalmos or lid lag, no myxedema, no xanthelasma; normal ears, nose and oropharynx Neck: normal jugular venous pulsations and no hepatojugular reflux; brisk carotid pulses without delay and no carotid bruits  Chest: clear to auscultation, no signs of consolidation by percussion or palpation, normal fremitus, symmetrical and full respiratory excursions Cardiovascular: normal position and quality of the apical impulse, regular rhythm, normal first and widely split second heart sounds, no murmurs, rubs or gallops Abdomen: no tenderness or distention, no masses by palpation, no abnormal pulsatility or arterial bruits, normal bowel sounds, no hepatosplenomegaly Extremities: no clubbing, cyanosis; symmetrical 1-2+ ankle edema; 2+ radial, ulnar and brachial pulses bilaterally; 2+ right femoral, posterior tibial and dorsalis pedis pulses; 2+ left femoral, posterior tibial and dorsalis pedis pulses; no subclavian or femoral bruits Neurological: grossly nonfocal Psych: Normal mood and affect   Wt Readings from Last 3 Encounters:  07/13/19 232 lb (105.2 kg)  05/04/18 237 lb (107.5 kg)  03/17/17 231 lb (104.8 kg)      Studies/Labs Reviewed:   EKG:  EKG is ordered today.  It shows sinus rhythm with occasional PACs and blocked PACs, incomplete right bundle branch block (old) Recent Labs: See HPI    Component Value Date/Time   CHOL 139 06/15/2019 0859   TRIG 81 06/15/2019 0859   HDL 53 06/15/2019 0859   CHOLHDL 2.6 06/15/2019 0859   CHOLHDL 2.9 12/21/2017 1020   VLDL 11 12/28/2016 1047   LDLCALC 70 06/15/2019 0859   LDLCALC 79 12/21/2017 1020    ASSESSMENT:    1. Chronic right heart failure ([place])   2. Coronary artery calcification   3. Aortic atherosclerosis ([place])   4. Hypercholesterolemia   5.  Polycythemia   6. Chronic obstructive pulmonary disease, unspecified COPD type ([place])   7. Abnormal serum level of alkaline phosphatase      PLAN:  In order of problems listed above:  1. CHF: No symptoms of left heart failure.  Mild edema is possibly related to right heart failure in the setting of COPD and obesity.  Discussed how to dose her diuretics to achieve symptom relief without increasing her risk of hyperkalemia or hypokalemia.  As long as she takes a diuretic intermittently, I think she can avoid taking a potassium supplement. 2. Coronary calcification: She does not have angina pectoris.  Low risk nuclear perfusion study in October 2017.  The focus is on treating the coronary risk factors.  3. Aortic atherosclerosis: identified on CT. no evidence of any significant coronary or peripheral vascular obstruction.  No history of stroke.. 4. HLP: LDL is at target.  Reviewed healthy diet, exercise, importance of some weight loss. 5. Polycythemia: Has gradually resolved after she stopped smoking. 6. COPD: Occasionally uses her inhaler. 7. Elevated alkaline phosphatase level: Mild and uncertain clinical significance.  We will repeat LFTs as well as calcium/phosphorus levels to see if this requires additional work-up.    Medication Adjustments/Labs and Tests Ordered: Current medicines are reviewed at length with the patient today.  Concerns regarding medicines are outlined above.  Medication changes, Labs and Tests ordered today are listed in the Patient Instructions below. Patient Instructions  Medication Instructions:  No changes If you need a refill on your cardiac medications before your next appointment, please call your pharmacy.  new Lab work: Your provider would like for you to return in the next month to have labs drawn: fasting labs. You do not need an appointment for the lab. Once in our office lobby there is a podium where you can sign in and ring the doorbell to alert [place] that  you are here. The lab is open from 8:00 am to 4:30 pm; closed for lunch from 12:45pm-1:45pm.  If you have labs (blood work) drawn today and your tests are completely normal, you will receive your results only by: Marland Kitchen MyChart Message (if you have MyChart) OR . A paper copy in the mail If you have any lab test that is abnormal or we need to change your treatment, we will call you to review the results.  Testing/Procedures: None ordered  Follow-Up: At [facility], you and your health needs are our priority.  As part of our continuing mission to provide you with exceptional heart care, we have created designated Provider Care Teams.  These Care Teams include your primary Cardiologist (physician) and Advanced Practice Providers (APPs -  Physician Assistants and Nurse Practitioners) who all work together to provide you with the care you need, when you need it. You will need a follow up appointment in 12 months.  Please call our office 2 months in advance to schedule this appointment.  You may see Sanda Klein, MD or one of the following Advanced Practice Providers on your designated Care Team: [place], [place] . Fabian Sharp, PA-C       Signed, Sanda Klein, MD  07/14/2019 12:53 PM    [address], [place], [place]  [id number] Phone: [phone]; Fax: [phone]

## 2019-07-14 ENCOUNTER — Encounter: Payer: Self-pay | Admitting: Cardiovascular Disease

## 2019-07-17 ENCOUNTER — Other Ambulatory Visit (INDEPENDENT_AMBULATORY_CARE_PROVIDER_SITE_OTHER): Payer: PPO

## 2019-07-17 DIAGNOSIS — I50812 Chronic right heart failure: Secondary | ICD-10-CM

## 2019-07-17 DIAGNOSIS — I700 Atherosclerosis of aorta: Secondary | ICD-10-CM | POA: Diagnosis not present

## 2019-07-17 DIAGNOSIS — I2584 Coronary atherosclerosis due to calcified coronary lesion: Secondary | ICD-10-CM

## 2019-07-17 DIAGNOSIS — I2510 Atherosclerotic heart disease of native coronary artery without angina pectoris: Secondary | ICD-10-CM | POA: Diagnosis not present

## 2019-08-28 ENCOUNTER — Other Ambulatory Visit: Payer: Self-pay | Admitting: Cardiovascular Disease

## 2020-06-30 ENCOUNTER — Other Ambulatory Visit: Payer: Self-pay

## 2020-06-30 DIAGNOSIS — E7800 Pure hypercholesterolemia, unspecified: Secondary | ICD-10-CM

## 2020-06-30 DIAGNOSIS — I50812 Chronic right heart failure: Secondary | ICD-10-CM

## 2020-06-30 DIAGNOSIS — I2584 Coronary atherosclerosis due to calcified coronary lesion: Secondary | ICD-10-CM | POA: Diagnosis not present

## 2020-06-30 DIAGNOSIS — I2510 Atherosclerotic heart disease of native coronary artery without angina pectoris: Secondary | ICD-10-CM | POA: Diagnosis not present

## 2020-07-01 LAB — COMPREHENSIVE METABOLIC PANEL
ALT: 13 IU/L (ref 0–32)
AST: 20 IU/L (ref 0–40)
Albumin/Globulin Ratio: 1.7 (ref 1.2–2.2)
Albumin: 4.3 g/dL (ref 3.7–4.7)
Alkaline Phosphatase: 113 IU/L (ref 48–121)
BUN/Creatinine Ratio: 19 (ref 12–28)
BUN: 16 mg/dL (ref 8–27)
Bilirubin Total: 0.7 mg/dL (ref 0.0–1.2)
CO2: 21 mmol/L (ref 20–29)
Calcium: 9.3 mg/dL (ref 8.7–10.3)
Chloride: 106 mmol/L (ref 96–106)
Creatinine, Ser: 0.85 mg/dL (ref 0.57–1.00)
GFR calc Af Amer: 80 mL/min/{1.73_m2} (ref >59)
GFR calc non Af Amer: 69 mL/min/{1.73_m2} (ref >59)
Globulin, Total: 2.5 g/dL (ref 1.5–4.5)
Glucose: 101 mg/dL — ABNORMAL HIGH (ref 65–99)
Potassium: 4.2 mmol/L (ref 3.5–5.2)
Sodium: 143 mmol/L (ref 134–144)
Total Protein: 6.8 g/dL (ref 6.0–8.5)

## 2020-07-01 LAB — LIPID PANEL
Chol/HDL Ratio: 3.3 ratio (ref 0.0–4.4)
Cholesterol, Total: 127 mg/dL (ref 100–199)
HDL: 39 mg/dL — ABNORMAL LOW (ref >39)
LDL Chol Calc (NIH): 68 mg/dL (ref 0–99)
Triglycerides: 110 mg/dL (ref 0–149)
VLDL Cholesterol Cal: 20 mg/dL (ref 5–40)

## 2020-07-01 LAB — CBC WITH DIFFERENTIAL/PLATELET
Basophils Absolute: 0 10*3/uL (ref 0.0–0.2)
Basos: 1 %
EOS (ABSOLUTE): 0.1 10*3/uL (ref 0.0–0.4)
Eos: 2 %
Hematocrit: 47.4 % — ABNORMAL HIGH (ref 34.0–46.6)
Hemoglobin: 15.6 g/dL (ref 11.1–15.9)
Immature Grans (Abs): 0 10*3/uL (ref 0.0–0.1)
Immature Granulocytes: 0 %
Lymphocytes Absolute: 0.8 10*3/uL (ref 0.7–3.1)
Lymphs: 27 %
MCH: 28.6 pg (ref 26.6–33.0)
MCHC: 32.9 g/dL (ref 31.5–35.7)
MCV: 87 fL (ref 79–97)
Monocytes Absolute: 0.2 10*3/uL (ref 0.1–0.9)
Monocytes: 7 %
Neutrophils Absolute: 1.8 10*3/uL (ref 1.4–7.0)
Neutrophils: 63 %
Platelets: 100 10*3/uL — CL (ref 150–450)
RBC: 5.45 x10E6/uL — ABNORMAL HIGH (ref 3.77–5.28)
RDW: 13.1 % (ref 11.7–15.4)
WBC: 2.8 10*3/uL — ABNORMAL LOW (ref 3.4–10.8)

## 2020-07-03 ENCOUNTER — Other Ambulatory Visit: Payer: Self-pay | Admitting: *Deleted

## 2020-07-03 DIAGNOSIS — D696 Thrombocytopenia, unspecified: Secondary | ICD-10-CM

## 2020-07-04 ENCOUNTER — Telehealth: Payer: Self-pay | Admitting: Hematology

## 2020-07-04 NOTE — Telephone Encounter (Signed)
Received a new hem referral from Dr. Royann Shivers at [facility] for thrombocytopenia. Ms. Pio has been cld and scheduled to see Dr. Mosetta Putt on 8/6 at 11am. Pt aware to arrive 15 minutes early.

## 2020-07-07 NOTE — Progress Notes (Signed)
[facility]   [phone] [phone]   Clinic New Consult Note   Patient Care Team: Merwyn Katos as PCP - General (Physician Assistant)  Date of Service:  07/11/2020   CHIEF COMPLAINTS/PURPOSE OF CONSULTATION:  Thrombocytopenia   REFERRING PHYSICIAN:  Cardiologist Dr Dani Gobble Croitoru   HISTORY OF PRESENTING ILLNESS:  Meghan Griffith 74 y.o. female is a here because of thrombocytopenia. The patient was referred by her Cardiologist Dr Sanda Klein. The patient presents to the clinic today alone  She has had low platelet count in as early as 2009 at mild level and intermittently low since. Her cardiologist notes her blood counts were low recently with WBC 2.8, plt 100K and referred he to my clinic.  Although her Hg is normal it has been elevated in the past, likely related to her H/o smoking. She notes h/o of normal periods. She denies bleeding issues after surgeries or procedures and never required blood transfusion. She notes she occasionally takes baby aspirin but cannot take it daily because it triggers her Acid reflux. She notes she does not eat meat and no dairy due to intolerance. She does eat vegetables and carbohydrates. She will take protein drink.  She notes she is not a Jehovah's Witness, but still adamantly against blood transfusion or bone marrow biopsy. She notes she mostly sees her PCP when she has issues. She notes she has had mammograms, but not yearly. She is adamantly against having COVID19 vaccine.   Socially she use to drink socially 1-2 times a year. She quit smoking in 2016. She does not use recreational drugs or herbal supplements. She is retires, single and ave 2 adult children.  She has a PMHx of Arthritis, cataracts, premature ventricular contraction, venous insufficiency. She had h/o knee surgery, cholecystectomy, vascular surgery, hernia repair and surgery of nose. She denies bleeding issues  She had a sister who  had a unknown head and neck cancer metastatic to lung and brain.     REVIEW OF SYSTEMS:   Constitutional: Denies fevers, chills or abnormal night sweats Eyes: Denies blurriness of vision, double vision or watery eyes Ears, nose, mouth, throat, and face: Denies mucositis or sore throat Respiratory: Denies cough, dyspnea or wheezes Cardiovascular: Denies palpitation, chest discomfort or lower extremity swelling Gastrointestinal:  Denies nausea, heartburn or change in bowel habits Skin: Denies abnormal skin rashes Lymphatics: Denies new lymphadenopathy or easy bruising Neurological:Denies numbness, tingling or new weaknesses Behavioral/Psych: Mood is stable, no new changes  All other systems were reviewed with the patient and are negative.  MEDICAL HISTORY:  Past Medical History:  Diagnosis Date  . Arthritis   . Cataracts, bilateral   . Cholelithiasis   . Chronic headache   . Incontinence   . Lower leg edema   . Phlebitis   . PVC (premature ventricular contraction)   . SOB (shortness of breath)   . Varicose veins   . Venous insufficiency (chronic) (peripheral)     SURGICAL HISTORY: Past Surgical History:  Procedure Laterality Date  . CHOLECYSTECTOMY    . collagen     injections  . ENDOVENOUS ABLATION SAPHENOUS VEIN W/ LASER Right 02-28-2014   right greater saphenous vein and stab phlebectomy 10-20 incisions right leg  by Curt Jews MD  . ENDOVENOUS ABLATION SAPHENOUS VEIN W/ LASER Left 07-18-2014   EVLA left greater saphenous vein, stab phlebectomy 10-20 incisions left leg , and sclerotherapy left leg    . ENDOVENOUS ABLATION SAPHENOUS VEIN W/ LASER  Left 08-01-2014   EVLA  left small saphenous vein  by Curt Jews MD  . HERNIA REPAIR    . INCISIONAL HERNIA REPAIR N/A 10/26/2013   Procedure: LAPAROSCOPIC INCISIONAL HERNIA;  Surgeon: Gayland Curry, MD;  Location: [facility];  Service: General;  Laterality: N/A;  . INSERTION OF MESH N/A 10/26/2013   Procedure: INSERTION OF MESH;   Surgeon: Gayland Curry, MD;  Location: [facility];  Service: General;  Laterality: N/A;  . KNEE ARTHROSCOPY  2002   left knee  . LAPAROSCOPIC CHOLECYSTECTOMY W/ CHOLANGIOGRAPHY  07/30/11  . NOSE SURGERY    . TUBAL LIGATION  1976  . vaginal sling      SOCIAL HISTORY: Social History   Socioeconomic History  . Marital status: Single    Spouse name: Not on file  . Number of children: 2  . Years of education: Not on file  . Highest education level: Not on file  Occupational History  . 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 Smoking status: Former Smoker    Packs/day: 1.00    Years: 28.00    Pack years: 28.00    Quit date: 09/20/2013    Years since quitting: 6.8  . Smokeless tobacco: Never Used  Substance and Sexual Activity  . Alcohol use: No  . Drug use: No  . Sexual activity: Not on file  Other Topics Concern  . Not on file  Social History Narrative   Pt is single.   Social Determinants of Health   Financial Resource Strain:   . Difficulty of Paying Living Expenses:   Food Insecurity:   . Worried About Charity fundraiser in the Last Year:   . Arboriculturist in the Last Year:   Transportation Needs:   . Film/video editor (Medical):   Marland Kitchen Lack of Transportation (Non-Medical):   Physical Activity:   . Days of Exercise per Week:   . Minutes of Exercise per Session:   Stress:   . Feeling of Stress :   Social Connections:   . Frequency of Communication with Friends and Family:   . Frequency of Social Gatherings with Friends and Family:   . Attends Religious Services:   . Active Member of Clubs or Organizations:   . Attends Archivist Meetings:   Marland Kitchen Marital Status:   Intimate Partner Violence:   . Fear of Current or Ex-Partner:   . Emotionally Abused:   Marland Kitchen Physically Abused:   . Sexually Abused:     FAMILY HISTORY: Family History  Problem Relation Age of Onset  . Heart disease Mother        Unknown type  . COPD Mother   .  Diabetes Mother   . Rheum arthritis Mother   . Hypertension Mother   . Other Mother        varicose veins  . Heart disease Father        Unknown type  . COPD Father   . Hypertension Father   . Diabetes Brother   . Heart disease Brother   . Hypertension Brother   . Cancer Sister        lung  . COPD Sister   . Heart disease Sister        Died of MI at age 68  . Hypertension Sister   . Coronary artery disease Other   . Hypertension Other     ALLERGIES:  is allergic to erythromycin base,  lansoprazole, prednisone, and tetracyclines & related.  MEDICATIONS:  Current Outpatient Medications  Medication Sig Dispense Refill  . albuterol (VENTOLIN HFA) 108 (90 Base) MCG/ACT inhaler Inhale 1-2 puffs into the lungs every 6 (six) hours as needed for wheezing or shortness of breath. 6.7 g 11  . atorvastatin (LIPITOR) 40 MG tablet TAKE 1 TABLET BY MOUTH EVERY DAY AT 6PM 90 tablet 3  . Famotidine (PEPCID PO) Take by mouth.    . furosemide (LASIX) 40 MG tablet TAKE 1 TABLET BY MOUTH 3 TIMES A WEEK ON MON/WED/FRI 39 tablet 3   No current facility-administered medications for this visit.    PHYSICAL EXAMINATION: ECOG PERFORMANCE STATUS: 0 - Asymptomatic  Vitals:   07/11/20 1125  BP: (!) 146/80  Pulse: 93  Resp: (!) 22  Temp: 97.9 F (36.6 C)  SpO2: 99%   Filed Weights   07/11/20 1125  Weight: 243 lb 1.6 oz (110.3 kg)    GENERAL:alert, no distress and comfortable SKIN: skin color, texture, turgor are normal, no rashes or significant lesions EYES: normal, Conjunctiva are pink and non-injected, sclera clear  NECK: supple, thyroid normal size, non-tender, without nodularity LYMPH:  no palpable lymphadenopathy in the cervical, axillary  LUNGS: clear to auscultation and percussion with normal breathing effort HEART: regular rate & rhythm and no murmurs (+)B/l lower extremity edema ABDOMEN:abdomen soft, non-tender and normal bowel sounds. No organomegaly  Musculoskeletal:no cyanosis  of digits and no clubbing  NEURO: alert & oriented x 3 with fluent speech, no focal motor/sensory deficits  LABORATORY DATA:  I have reviewed the data as listed CBC Latest Ref Rng & Units 06/30/2020 12/21/2017 10/22/2013  WBC 3.4 - 10.8 x10E3/uL 2.8(L) 4.2 7.9  Hemoglobin 11.1 - 15.9 g/dL 15.6 15.1 15.4(H)  Hematocrit 34.0 - 46.6 % 47.4(H) 46.0(H) 46.1(H)  Platelets 150 - 450 x10E3/uL 100(LL) 126(L) 102(L)    CMP Latest Ref Rng & Units 06/30/2020 06/15/2019 12/21/2017  Glucose 65 - 99 mg/dL 101(H) 93 104(H)  BUN 8 - 27 mg/dL '16 13 12  '$ Creatinine 0.57 - 1.00 mg/dL 0.85 0.83 1.01(H)  Sodium 134 - 144 mmol/L 143 139 141  Potassium 3.5 - 5.2 mmol/L 4.2 4.0 4.2  Chloride 96 - 106 mmol/L 106 102 105  CO2 20 - 29 mmol/L '21 20 27  '$ Calcium 8.7 - 10.3 mg/dL 9.3 9.4 9.6  Total Protein 6.0 - 8.5 g/dL 6.8 7.0 7.1  Total Bilirubin 0.0 - 1.2 mg/dL 0.7 0.8 0.9  Alkaline Phos 48 - 121 IU/L 113 132(H) -  AST 0 - 40 IU/L '20 27 23  '$ ALT 0 - 32 IU/L '13 19 20     '$ RADIOGRAPHIC STUDIES: I have personally reviewed the radiological images as listed and agreed with the findings in the report. No results found.  ASSESSMENT & PLAN:  Meghan Griffith is a 74 y.o. Caucasian female with a history of Arthritis, cataracts, premature ventricular contraction, venous insufficiency.   1. Chronic mild thrombocytopenia and new neutropenia   -She has had mildly low platelet count as early as 2009 and has been intermittent since then with plt in 100-150K range. Her cardiologist notes her blood counts were low on 06/30/20 with WBC 2.8, plt 100K and referred he to my clinic. WBC were normal before  -Although her Hg is normal it has been elevated in the past, likely related to her H/o smoking.  -I discussed common etiology of thrombocytopenia, including  medications, low folate or B12, liver disease, virus such as HBV, HCV or  HIV infections, alcohol use, autoimmune related ITP, or primary bone marrow disease such as MDS.  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  I have very low suspicion of this being related to bone marrow disease so will hold on bone marrow biopsy.  -She is on Pepcid regularly, she does not drink alcohol, she does not eat meat or dairy.  -I will check her folate, B12, and hepatitis panel, I will review her blood smear also  -With her mild level this can be monitored without intervention until this becomes significant. She can continue her PPI, but try to avoid NSAIDs as this can impact her platelets.  -Her 2009 US abdomen showed normal spleen and mild fatty liver.  - I will repeat US Abdomen -Will monitor her labs every 4-6 months  -F/u in 1 year    2. Comorbidities: Chronic right heart failure, Coronary artery calcifications, COPD, Acid Reflux -She is being followed by Cardiologist Dr Dani Gobble Croitoru, on medications.  -She uses Pepcid for her Acid Reflux -I encouraged her to follow up with PCP at least yearly. She has declined receiving Vaccines.  -She has had mammogram before but does not continue yearly. I discussed risk of right breast cancer and yearly screening for early breast cancer detection. She will think about it.     PLAN:  -Lab today  -US Abdomen at [facility] in 1 month -Lab every 4-6 months at PCP  -F/u in 1 year    Orders Placed This Encounter  Procedures  . US Abdomen Complete    Standing Status:   Future    Standing Expiration Date:   07/11/2021    Order Specific Question:   Reason for Exam (SYMPTOM  OR DIAGNOSIS REQUIRED)    Answer:   evaluate liver and spleen    Order Specific Question:   Preferred imaging location?    Answer:   [facility]    All questions were answered. The patient knows to call the clinic with any problems, questions or concerns. The total time spent in the appointment was 30 minutes.     Truitt Merle, MD 07/11/2020 12:23 PM  I, Joslyn Devon, am acting as scribe for Truitt Merle, MD.   I have reviewed the above documentation  for accuracy and completeness, and I agree with the above.

## 2020-07-11 ENCOUNTER — Encounter: Payer: Self-pay | Admitting: Hematology

## 2020-07-11 ENCOUNTER — Other Ambulatory Visit: Payer: Self-pay

## 2020-07-11 ENCOUNTER — Inpatient Hospital Stay: Payer: PPO

## 2020-07-11 ENCOUNTER — Inpatient Hospital Stay: Payer: PPO | Attending: Hematology | Admitting: Hematology

## 2020-07-11 VITALS — BP 146/80 | HR 93 | Temp 97.9°F | Resp 22 | Ht 65.5 in | Wt 243.1 lb

## 2020-07-11 DIAGNOSIS — I50812 Chronic right heart failure: Secondary | ICD-10-CM | POA: Diagnosis not present

## 2020-07-11 DIAGNOSIS — Z87891 Personal history of nicotine dependence: Secondary | ICD-10-CM | POA: Insufficient documentation

## 2020-07-11 DIAGNOSIS — I2510 Atherosclerotic heart disease of native coronary artery without angina pectoris: Secondary | ICD-10-CM | POA: Insufficient documentation

## 2020-07-11 DIAGNOSIS — Z7982 Long term (current) use of aspirin: Secondary | ICD-10-CM | POA: Insufficient documentation

## 2020-07-11 DIAGNOSIS — D696 Thrombocytopenia, unspecified: Secondary | ICD-10-CM

## 2020-07-11 DIAGNOSIS — Z79899 Other long term (current) drug therapy: Secondary | ICD-10-CM | POA: Insufficient documentation

## 2020-07-11 DIAGNOSIS — K219 Gastro-esophageal reflux disease without esophagitis: Secondary | ICD-10-CM | POA: Insufficient documentation

## 2020-07-11 DIAGNOSIS — K760 Fatty (change of) liver, not elsewhere classified: Secondary | ICD-10-CM | POA: Diagnosis not present

## 2020-07-11 DIAGNOSIS — D709 Neutropenia, unspecified: Secondary | ICD-10-CM | POA: Diagnosis not present

## 2020-07-11 DIAGNOSIS — J449 Chronic obstructive pulmonary disease, unspecified: Secondary | ICD-10-CM | POA: Diagnosis not present

## 2020-07-11 DIAGNOSIS — Z801 Family history of malignant neoplasm of trachea, bronchus and lung: Secondary | ICD-10-CM | POA: Diagnosis not present

## 2020-07-11 DIAGNOSIS — Z809 Family history of malignant neoplasm, unspecified: Secondary | ICD-10-CM | POA: Diagnosis not present

## 2020-07-11 DIAGNOSIS — E538 Deficiency of other specified B group vitamins: Secondary | ICD-10-CM

## 2020-07-11 LAB — CBC WITH DIFFERENTIAL (CANCER CENTER ONLY)
Abs Immature Granulocytes: 0.02 10*3/uL (ref 0.00–0.07)
Basophils Absolute: 0 10*3/uL (ref 0.0–0.1)
Basophils Relative: 1 %
Eosinophils Absolute: 0 10*3/uL (ref 0.0–0.5)
Eosinophils Relative: 0 %
HCT: 45.3 % (ref 36.0–46.0)
Hemoglobin: 14.7 g/dL (ref 12.0–15.0)
Immature Granulocytes: 0 %
Lymphocytes Relative: 15 %
Lymphs Abs: 0.8 10*3/uL (ref 0.7–4.0)
MCH: 28.2 pg (ref 26.0–34.0)
MCHC: 32.5 g/dL (ref 30.0–36.0)
MCV: 86.8 fL (ref 80.0–100.0)
Monocytes Absolute: 0.4 10*3/uL (ref 0.1–1.0)
Monocytes Relative: 7 %
Neutro Abs: 4.2 10*3/uL (ref 1.7–7.7)
Neutrophils Relative %: 77 %
Platelet Count: 123 10*3/uL — ABNORMAL LOW (ref 150–400)
RBC: 5.22 MIL/uL — ABNORMAL HIGH (ref 3.87–5.11)
RDW: 13.3 % (ref 11.5–15.5)
WBC Count: 5.5 10*3/uL (ref 4.0–10.5)
nRBC: 0 % (ref 0.0–0.2)

## 2020-07-11 LAB — HEPATITIS PANEL, ACUTE
HCV Ab: NONREACTIVE
Hep A IgM: NONREACTIVE
Hep B C IgM: NONREACTIVE
Hepatitis B Surface Ag: NONREACTIVE

## 2020-07-11 LAB — FOLATE: Folate: 17.4 ng/mL (ref >5.9)

## 2020-07-11 LAB — VITAMIN B12: Vitamin B-12: 156 pg/mL — ABNORMAL LOW (ref 180–914)

## 2020-07-11 LAB — TECHNOLOGIST SMEAR REVIEW

## 2020-07-11 LAB — IMMATURE PLATELET FRACTION: Immature Platelet Fraction: 15.8 % — ABNORMAL HIGH (ref 1.2–8.6)

## 2020-07-11 LAB — HIV ANTIBODY (ROUTINE TESTING W REFLEX): HIV Screen 4th Generation wRfx: NONREACTIVE

## 2020-07-13 ENCOUNTER — Encounter: Payer: Self-pay | Admitting: Hematology

## 2020-07-15 ENCOUNTER — Other Ambulatory Visit: Payer: Self-pay | Admitting: Hematology

## 2020-07-15 ENCOUNTER — Telehealth: Payer: Self-pay | Admitting: *Deleted

## 2020-07-15 DIAGNOSIS — E538 Deficiency of other specified B group vitamins: Secondary | ICD-10-CM | POA: Insufficient documentation

## 2020-07-15 NOTE — Addendum Note (Signed)
Addended by: Malachy Mood on: 07/15/2020 11:53 AM   Modules accepted: Orders

## 2020-07-15 NOTE — Telephone Encounter (Signed)
Notified of message below. OK with injection X 3.  Message to scheduler

## 2020-07-15 NOTE — Telephone Encounter (Signed)
-----   Message from Malachy Mood, MD sent at 07/15/2020 11:39 AM EDT ----- Please let pt know her lab results, plt still slightly low, WBC normal, no hepatitis, B12 level is low which could cause low plt. I recommend B12 injection monthly for 3 months then will repeat lab and see me in 3 months, please schedule if she agrees, will decide if she needs to continue injection or switch to oral B12 on next visit. Thanks   Malachy Mood  07/15/2020

## 2020-07-18 ENCOUNTER — Telehealth: Payer: Self-pay | Admitting: Hematology

## 2020-07-18 NOTE — Telephone Encounter (Signed)
Scheduled appt per 8/10 sch msg - left message for patient with appt date and time   

## 2020-07-23 ENCOUNTER — Other Ambulatory Visit: Payer: Self-pay

## 2020-07-23 ENCOUNTER — Inpatient Hospital Stay: Payer: PPO

## 2020-07-23 VITALS — BP 147/99 | HR 83 | Resp 18

## 2020-07-23 DIAGNOSIS — E538 Deficiency of other specified B group vitamins: Secondary | ICD-10-CM

## 2020-07-23 DIAGNOSIS — D696 Thrombocytopenia, unspecified: Secondary | ICD-10-CM | POA: Diagnosis not present

## 2020-07-23 MED ORDER — CYANOCOBALAMIN 1000 MCG/ML IJ SOLN
1000.0000 ug | Freq: Once | INTRAMUSCULAR | Status: AC
  Administered 2020-07-23: 1000 ug via INTRAMUSCULAR

## 2020-07-23 NOTE — Patient Instructions (Signed)
Cyanocobalamin, Pyridoxine, and Folate What is this medicine? A multivitamin containing folic acid, vitamin B6, and vitamin B12. This medicine may be used for other purposes; ask your health care provider or pharmacist if you have questions. COMMON BRAND NAME(S): AllanFol RX, AllanTex, Av-Vite FB, B Complex with Folic Acid, ComBgen, FaBB, Folamin, Folastin, Folbalin, Folbee, Folbic, Folcaps, Folgard, Folgard RX, Folgard RX 2.2, Folplex, Folplex 2.2, Foltabs 800, Foltx, Homocysteine Formula, Niva-Fol, NuFol, TL Gard RX, Virt-Gard, Virt-Vite, Virt-Vite Forte, Vita-Respa What should I tell my health care provider before I take this medicine? They need to know if you have any of these conditions:  bleeding or clotting disorder  history of anemia of any type  other chronic health condition  an unusual or allergic reaction to vitamins, other medicines, foods, dyes, or preservatives  pregnant or trying to get pregnant  breast-feeding How should I use this medicine? Take by mouth with a glass of water. May take with food. Follow the directions on the prescription label. It is usually given once a day. Do not take your medicine more often than directed. Contact your pediatrician regarding the use of this medicine in children. Special care may be needed. Overdosage: If you think you have taken too much of this medicine contact a poison control center or emergency room at once. NOTE: This medicine is only for you. Do not share this medicine with others. What if I miss a dose? If you miss a dose, take it as soon as you can. If it is almost time for your next dose, take only that dose. Do not take double or extra doses. What may interact with this medicine?  levodopa This list may not describe all possible interactions. Give your health care provider a list of all the medicines, herbs, non-prescription drugs, or dietary supplements you use. Also tell them if you smoke, drink alcohol, or use illegal  drugs. Some items may interact with your medicine. What should I watch for while using this medicine? See your health care professional for regular checks on your progress. Remember that vitamin supplements do not replace the need for good nutrition from a balanced diet. What side effects may I notice from receiving this medicine? Side effects that you should report to your doctor or health care professional as soon as possible:  allergic reaction such as skin rash or difficulty breathing  vomiting Side effects that usually do not require medical attention (report to your doctor or health care professional if they continue or are bothersome):  nausea  stomach upset This list may not describe all possible side effects. Call your doctor for medical advice about side effects. You may report side effects to FDA at 1-800-FDA-1088. Where should I keep my medicine? Keep out of the reach of children. Most vitamins should be stored at controlled room temperature. Check your specific product directions. Protect from heat and moisture. Throw away any unused medicine after the expiration date. NOTE: This sheet is a summary. It may not cover all possible information. If you have questions about this medicine, talk to your doctor, pharmacist, or health care provider.  2020 Elsevier/Gold Standard (2008-01-13 00:59:55)  

## 2020-08-05 ENCOUNTER — Ambulatory Visit: Payer: PPO | Admitting: Cardiovascular Disease

## 2020-08-10 ENCOUNTER — Other Ambulatory Visit: Payer: Self-pay | Admitting: Cardiovascular Disease

## 2020-08-12 ENCOUNTER — Ambulatory Visit (HOSPITAL_COMMUNITY)
Admission: RE | Admit: 2020-08-12 | Discharge: 2020-08-12 | Disposition: A | Discharge status: Home / Self Care | Payer: PPO | Source: Ambulatory Visit | Attending: Hematology | Admitting: Hematology

## 2020-08-12 ENCOUNTER — Other Ambulatory Visit: Payer: Self-pay

## 2020-08-12 DIAGNOSIS — D696 Thrombocytopenia, unspecified: Secondary | ICD-10-CM | POA: Insufficient documentation

## 2020-08-12 DIAGNOSIS — Z9049 Acquired absence of other specified parts of digestive tract: Secondary | ICD-10-CM | POA: Diagnosis not present

## 2020-08-18 ENCOUNTER — Telehealth: Payer: Self-pay

## 2020-08-18 NOTE — Telephone Encounter (Signed)
Called spoke with pt made her aware of [place] results appreciative of call nothing further call ended

## 2020-08-18 NOTE — Telephone Encounter (Signed)
-----   Message from Malachy Mood, MD sent at 08/16/2020  8:55 PM EDT ----- Please let pt know her abdominal US was unremarkable, thanks   Malachy Mood

## 2020-08-18 NOTE — Telephone Encounter (Signed)
-----   Message from Yan Feng, MD sent at 08/16/2020  8:55 PM EDT ----- Please let pt know her abdominal US was unremarkable, thanks   Yan Feng  

## 2020-08-22 ENCOUNTER — Inpatient Hospital Stay: Payer: PPO | Attending: Hematology

## 2020-08-22 ENCOUNTER — Other Ambulatory Visit: Payer: Self-pay

## 2020-08-22 VITALS — BP 167/90 | HR 75 | Temp 97.9°F | Resp 18

## 2020-08-22 DIAGNOSIS — D519 Vitamin B12 deficiency anemia, unspecified: Secondary | ICD-10-CM | POA: Diagnosis not present

## 2020-08-22 DIAGNOSIS — E538 Deficiency of other specified B group vitamins: Secondary | ICD-10-CM

## 2020-08-22 MED ORDER — CYANOCOBALAMIN 1000 MCG/ML IJ SOLN
INTRAMUSCULAR | Status: AC
  Filled 2020-08-22: qty 1

## 2020-08-22 MED ORDER — CYANOCOBALAMIN 1000 MCG/ML IJ SOLN
1000.0000 ug | Freq: Once | INTRAMUSCULAR | Status: AC
  Administered 2020-08-22: 1000 ug via INTRAMUSCULAR

## 2020-08-22 NOTE — Patient Instructions (Signed)

## 2020-09-04 ENCOUNTER — Other Ambulatory Visit: Payer: Self-pay

## 2020-09-04 ENCOUNTER — Encounter: Payer: Self-pay | Admitting: Cardiovascular Disease

## 2020-09-04 ENCOUNTER — Ambulatory Visit (INDEPENDENT_AMBULATORY_CARE_PROVIDER_SITE_OTHER): Payer: PPO | Admitting: Cardiovascular Disease

## 2020-09-04 VITALS — BP 148/92 | HR 72 | Ht 66.0 in | Wt 247.0 lb

## 2020-09-04 DIAGNOSIS — I2584 Coronary atherosclerosis due to calcified coronary lesion: Secondary | ICD-10-CM | POA: Diagnosis not present

## 2020-09-04 DIAGNOSIS — J449 Chronic obstructive pulmonary disease, unspecified: Secondary | ICD-10-CM | POA: Diagnosis not present

## 2020-09-04 DIAGNOSIS — E7800 Pure hypercholesterolemia, unspecified: Secondary | ICD-10-CM | POA: Diagnosis not present

## 2020-09-04 DIAGNOSIS — I2510 Atherosclerotic heart disease of native coronary artery without angina pectoris: Secondary | ICD-10-CM | POA: Diagnosis not present

## 2020-09-04 DIAGNOSIS — I50812 Chronic right heart failure: Secondary | ICD-10-CM

## 2020-09-04 DIAGNOSIS — I700 Atherosclerosis of aorta: Secondary | ICD-10-CM

## 2020-09-04 MED ORDER — LOSARTAN POTASSIUM 50 MG PO TABS: 50.0000 mg | ORAL_TABLET | Freq: Every day | ORAL | 5 refills | Status: DC

## 2020-09-04 NOTE — Patient Instructions (Signed)
Medication Instructions:  START Losartan 50 mg once daily  *If you need a refill on your cardiac medications before your next appointment, please call your pharmacy*   Lab Work: None ordered If you have labs (blood work) drawn today and your tests are completely normal, you will receive your results only by: Marland Kitchen MyChart Message (if you have MyChart) OR . A paper copy in the mail If you have any lab test that is abnormal or we need to change your treatment, we will call you to review the results.   Testing/Procedures: None ordered   Follow-Up: At [facility], you and your health needs are our priority.  As part of our continuing mission to provide you with exceptional heart care, we have created designated Provider Care Teams.  These Care Teams include your primary Cardiologist (physician) and Advanced Practice Providers (APPs -  Physician Assistants and Nurse Practitioners) who all work together to provide you with the care you need, when you need it.  We recommend signing up for the patient portal called "MyChart".  Sign up information is provided on this After Visit Summary.  MyChart is used to connect with patients for Virtual Visits (Telemedicine).  Patients are able to view lab/test results, encounter notes, upcoming appointments, etc.  Non-urgent messages can be sent to your provider as well.   To learn more about what you can do with MyChart, go to [email or web address].    Your next appointment:   Follow up with PharmD in one month for blood pressure monitoring Follow up with Dr. Royann Shivers in 12 months  Other Instructions Please elevate your legs when possible.

## 2020-09-04 NOTE — Progress Notes (Signed)
Cardiology Office Note    Date:  09/06/2020   ID:  Meghan DibblesBarbara A Griffith, DOB 1946-09-11, MRN [id number]  PCP:  Meghan Griffith, Meghan J, PA-C  Cardiologist:   Meghan FairMihai Martese Vanatta, MD  Referred by: Meghan Griffith, [facility]  Chief Complaint  Patient presents with  . Coronary Artery Disease    History of Present Illness:  Meghan Griffith is a 74 y.o. female with extensive coronary calcifications on chest CT but with normal left ventricular systolic function, normal myocardial perfusion pattern, without angina.   The patient specifically denies any chest pain at rest exertion, dyspnea at rest or with exertion, orthopnea, paroxysmal nocturnal dyspnea, syncope, palpitations, focal neurological deficits, intermittent claudication, lower extremity edema, unexplained weight gain, cough, hemoptysis or wheezing.  She feels a little stronger after starting B12 injections.  Her blood pressure has been consistently high with systolic in the high 140s-160s and diastolic above 90 consistently.  She has been compliant with her medications.  Her ECG shows sinus rhythm with first-degree AV block but no ischemic repolarization abnormalities.  Her most recent LDL in late July was 1468 and her kidney function is normal.  She does not have diabetes and does not smoke.  Her echo was interpreted as showing grade 1 diastolic dysfunction, but I think it was normal. Her lateral and medial diastolic annular velocities were 12 and 10 cm/second respectively, normal for her age.  Past Medical History:  Diagnosis Date  . Arthritis   . Cataracts, bilateral   . Cholelithiasis   . Chronic headache   . Incontinence   . Lower leg edema   . Phlebitis   . PVC (premature ventricular contraction)   . SOB (shortness of breath)   . Varicose veins   . Venous insufficiency (chronic) (peripheral)     Past Surgical History:  Procedure Laterality Date  . CHOLECYSTECTOMY    . collagen     injections  . ENDOVENOUS ABLATION  SAPHENOUS VEIN W/ LASER Right 02-28-2014   right greater saphenous vein and stab phlebectomy 10-20 incisions right leg  by Meghan Griffith Early MD  . ENDOVENOUS ABLATION SAPHENOUS VEIN W/ LASER Left 07-18-2014   EVLA left greater saphenous vein, stab phlebectomy 10-20 incisions left leg , and sclerotherapy left leg    . ENDOVENOUS ABLATION SAPHENOUS VEIN W/ LASER Left 08-01-2014   EVLA  left small saphenous vein  by Meghan Griffith Early MD  . HERNIA REPAIR    . INCISIONAL HERNIA REPAIR N/A 10/26/2013   Procedure: LAPAROSCOPIC INCISIONAL HERNIA;  Surgeon: Meghan InaEric M Wilson, MD;  Location: [facility];  Service: General;  Laterality: N/A;  . INSERTION OF MESH N/A 10/26/2013   Procedure: INSERTION OF MESH;  Surgeon: Meghan InaEric M Wilson, MD;  Location: [facility];  Service: General;  Laterality: N/A;  . KNEE ARTHROSCOPY  2002   left knee  . LAPAROSCOPIC CHOLECYSTECTOMY W/ CHOLANGIOGRAPHY  07/30/11  . NOSE SURGERY    . TUBAL LIGATION  1976  . vaginal sling      Current Medications: Outpatient Medications Prior to Visit  Medication Sig Dispense Refill  . albuterol (VENTOLIN HFA) 108 (90 Base) MCG/ACT inhaler INHALE 1-2 PUFFS INTO THE LUNGS EVERY 6 (SIX) HOURS AS NEEDED FOR WHEEZING OR SHORTNESS OF BREATH. 6.7 g 11  . atorvastatin (LIPITOR) 40 MG tablet TAKE 1 TABLET BY MOUTH EVERY DAY AT 6PM 90 tablet 3  . Famotidine (PEPCID PO) Take by mouth.    . furosemide (LASIX) 40 MG tablet TAKE 1 TABLET BY MOUTH 3 TIMES A  WEEK ON MON/WED/FRI 39 tablet 3   No facility-administered medications prior to visit.     Allergies:   Erythromycin base, Lansoprazole, Prednisone, and Tetracyclines & related   Social History   Socioeconomic History  . Marital status: Single    Spouse name: Not on file  . Number of children: 2  . Years of education: Not on file  . Highest education level: Not on file  Occupational History  . Occupation: Public affairs consultant: [employer]  Tobacco Use  . Smoking status: Former Smoker    Packs/day: 1.00     Years: 28.00    Pack years: 28.00    Quit date: 09/20/2013    Years since quitting: 6.9  . Smokeless tobacco: Never Used  Substance and Sexual Activity  . Alcohol use: No  . Drug use: No  . Sexual activity: Not on file  Other Topics Concern  . Not on file  Social History Narrative   Pt is single.   Social Determinants of Health   Financial Resource Strain:   . Difficulty of Paying Living Expenses: Not on file  Food Insecurity:   . Worried About Programme researcher, broadcasting/film/video in the Last Year: Not on file  . Ran Out of Food in the Last Year: Not on file  Transportation Needs:   . Lack of Transportation (Medical): Not on file  . Lack of Transportation (Non-Medical): Not on file  Physical Activity:   . Days of Exercise per Week: Not on file  . Minutes of Exercise per Session: Not on file  Stress:   . Feeling of Stress : Not on file  Social Connections:   . Frequency of Communication with Friends and Family: Not on file  . Frequency of Social Gatherings with Friends and Family: Not on file  . Attends Religious Services: Not on file  . Active Member of Clubs or Organizations: Not on file  . Attends Banker Meetings: Not on file  . Marital Status: Not on file     Family History:  The patient's family history includes COPD in her father, mother, and sister; Cancer in her sister; Coronary artery disease in an other family member; Diabetes in her brother and mother; Heart disease in her brother, father, mother, and sister; Hypertension in her brother, father, mother, sister, and another family member; Other in her mother; Rheum arthritis in her mother.   ROS:   Please see the history of present illness.    ROS  All other systems are reviewed and are negative.   PHYSICAL EXAM:   VS:  BP (!) 148/92   Pulse 72   Ht 5\' 6"  (1.676 m)   Wt 247 lb (112 kg)   SpO2 100%   BMI 39.87 kg/m      General: Alert, oriented x3, no distress, severely obese Head: no evidence of trauma,  PERRL, EOMI, no exophtalmos or lid lag, no myxedema, no xanthelasma; normal ears, nose and oropharynx Neck: normal jugular venous pulsations and no hepatojugular reflux; brisk carotid pulses without delay and no carotid bruits Chest: clear to auscultation, no signs of consolidation by percussion or palpation, normal fremitus, symmetrical and full respiratory excursions Cardiovascular: normal position and quality of the apical impulse, regular rhythm, normal first and second heart sounds, no murmurs, rubs or gallops Abdomen: no tenderness or distention, no masses by palpation, no abnormal pulsatility or arterial bruits, normal bowel sounds, no hepatosplenomegaly Extremities: no clubbing, cyanosis; symmetrical 1+ ankle edema; 2+ radial,  ulnar and brachial pulses bilaterally; 2+ right femoral, posterior tibial and dorsalis pedis pulses; 2+ left femoral, posterior tibial and dorsalis pedis pulses; no subclavian or femoral bruits Neurological: grossly nonfocal Psych: Normal mood and affect   Wt Readings from Last 3 Encounters:  09/04/20 247 lb (112 kg)  07/11/20 243 lb 1.6 oz (110.3 kg)  07/13/19 232 lb (105.2 kg)      Studies/Labs Reviewed:   EKG:  EKG is ordered today.  Shows sinus rhythm first-degree AV block, incomplete right bundle branch block, normal repolarization pattern, QTC 455 ms Recent Labs: See HPI    Component Value Date/Time   CHOL 127 06/30/2020 0906   TRIG 110 06/30/2020 0906   HDL 39 (L) 06/30/2020 0906   CHOLHDL 3.3 06/30/2020 0906   CHOLHDL 2.9 12/21/2017 1020   VLDL 11 12/28/2016 1047   LDLCALC 68 06/30/2020 0906   LDLCALC 79 12/21/2017 1020    ASSESSMENT:    1. Chronic right heart failure (HCC)   2. Coronary artery calcification   3. Aortic atherosclerosis (HCC)   4. Hypercholesterolemia   5. Chronic obstructive pulmonary disease, unspecified COPD type (HCC)      PLAN:  In order of problems listed above:  1. CHF: Her edema is probably partly related to  peripheral venous insufficiency and partly to possible right heart failure related to COPD and obesity.  I remain unconvinced that she has diastolic left heart failure.  Edema is well controlled with intermittent dosing of her loop diuretic.  Okay to take extra doses if needed, but she should call and let us know mostly see if she needs a potassium supplement. 2. HTN: losartan 50 mg daily added today. 3. Coronary calcification: Remains asymptomatic.  Low risk nuclear perfusion study in October 2017.  The focus is on treating the coronary risk factors, which appear well addressed, except for her BP and obesity.  4. Aortic atherosclerosis: identified on CT. no evidence of any significant coronary or peripheral vascular obstruction.  No history of stroke. 5. HLP: on statin. LDL at target <70 6. COPD: Occasionally uses her inhaler. 7. Elevated alkaline phosphatase level: Mild and uncertain clinical significance. Resolved on recent labs.    Medication Adjustments/Labs and Tests Ordered: Current medicines are reviewed at length with the patient today.  Concerns regarding medicines are outlined above.  Medication changes, Labs and Tests ordered today are listed in the Patient Instructions below. Patient Instructions  Medication Instructions:  START Losartan 50 mg once daily  *If you need a refill on your cardiac medications before your next appointment, please call your pharmacy*   Lab Work: None ordered If you have labs (blood work) drawn today and your tests are completely normal, you will receive your results only by: Marland Kitchen MyChart Message (if you have MyChart) OR . A paper copy in the mail If you have any lab test that is abnormal or we need to change your treatment, we will call you to review the results.   Testing/Procedures: None ordered   Follow-Up: At [facility], you and your health needs are our priority.  As part of our continuing mission to provide you with exceptional heart  care, we have created designated Provider Care Teams.  These Care Teams include your primary Cardiologist (physician) and Advanced Practice Providers (APPs -  Physician Assistants and Nurse Practitioners) who all work together to provide you with the care you need, when you need it.  We recommend signing up for the patient portal called "MyChart".  Sign up information is provided on this After Visit Summary.  MyChart is used to connect with patients for Virtual Visits (Telemedicine).  Patients are able to view lab/test results, encounter notes, upcoming appointments, etc.  Non-urgent messages can be sent to your provider as well.   To learn more about what you can do with MyChart, go to [email or web address].    Your next appointment:   Follow up with PharmD in one month for blood pressure monitoring Follow up with Dr. Royann Shivers in 12 months  Other Instructions Please elevate your legs when possible.      Signed, Meghan Fair, MD  09/06/2020 8:38 PM    [facility] [address] [place], [place], [place]  [address] Phone: [phone]; Fax: [phone]

## 2020-09-06 ENCOUNTER — Encounter: Payer: Self-pay | Admitting: Cardiovascular Disease

## 2020-09-08 ENCOUNTER — Other Ambulatory Visit: Payer: Self-pay | Admitting: Cardiovascular Disease

## 2020-09-08 NOTE — Addendum Note (Signed)
Addended by: Brunetta Genera on: 09/08/2020 02:51 PM   Modules accepted: Orders

## 2020-09-09 IMAGING — US US ABDOMEN COMPLETE
1 series · 14 of 25 positions shown · non-contrast
Comparison: September 07, 2008.

CLINICAL DATA: Thrombocytopenia.

EXAM:
ABDOMEN ULTRASOUND COMPLETE

[Series 1: us abdomen complete · 14 of 61 slices shown]
[im 1/61]
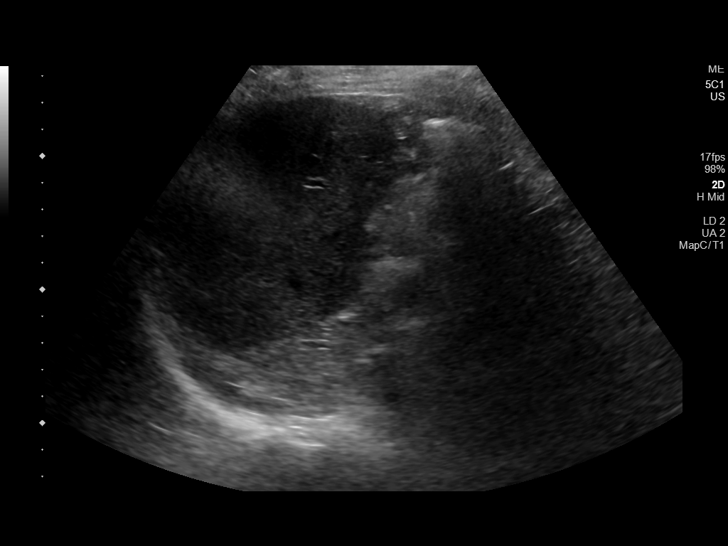
[im 6/61]
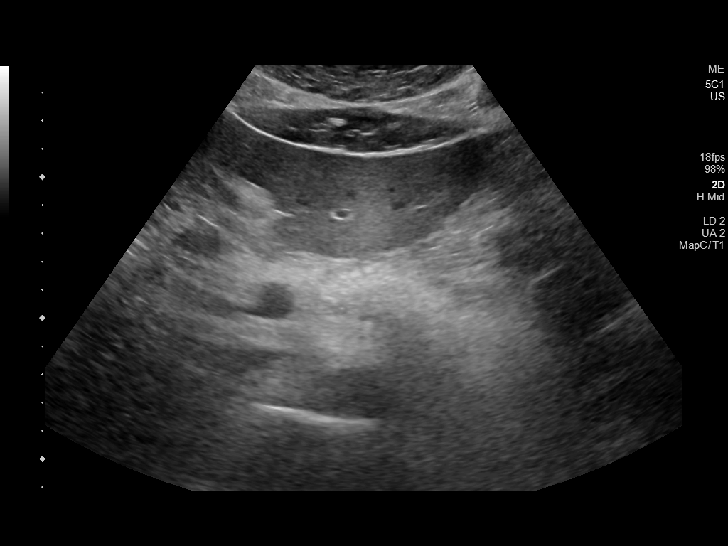
[im 11/61]
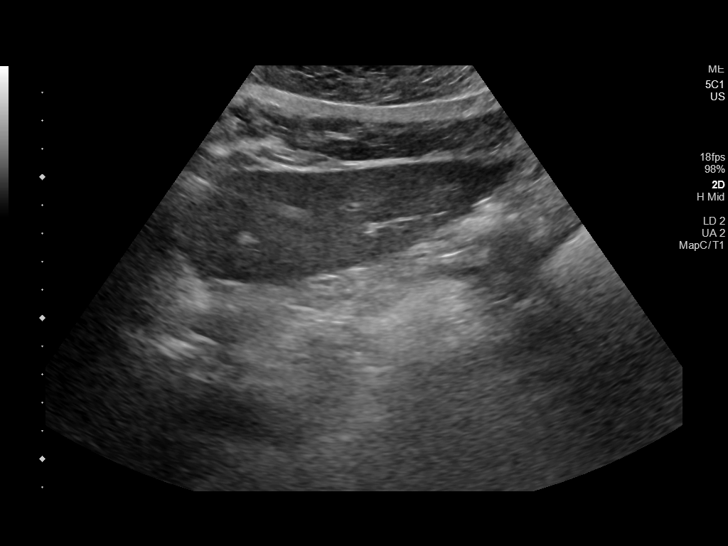
[im 16/61]
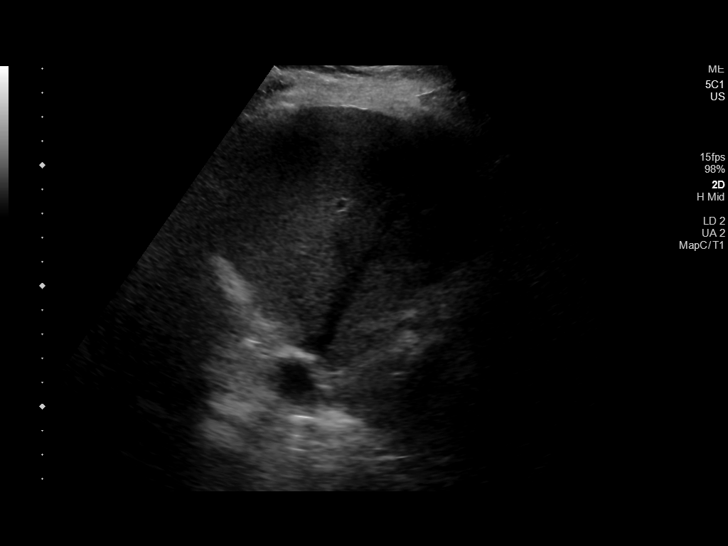
[im 21/61]
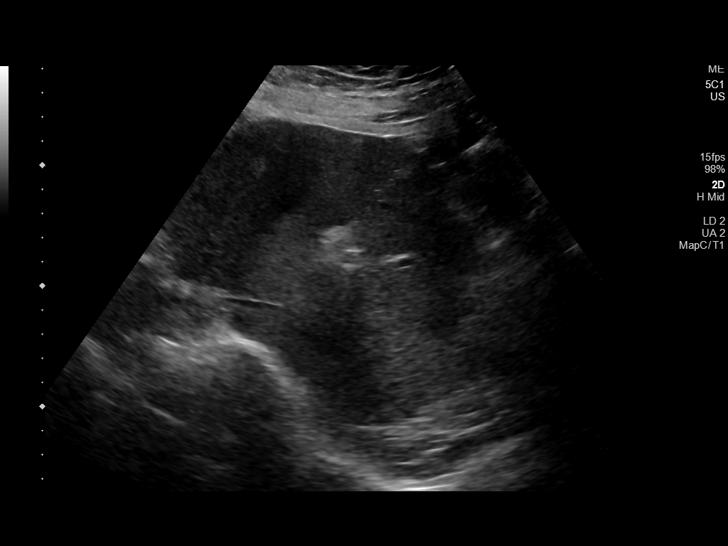
[im 23/61]
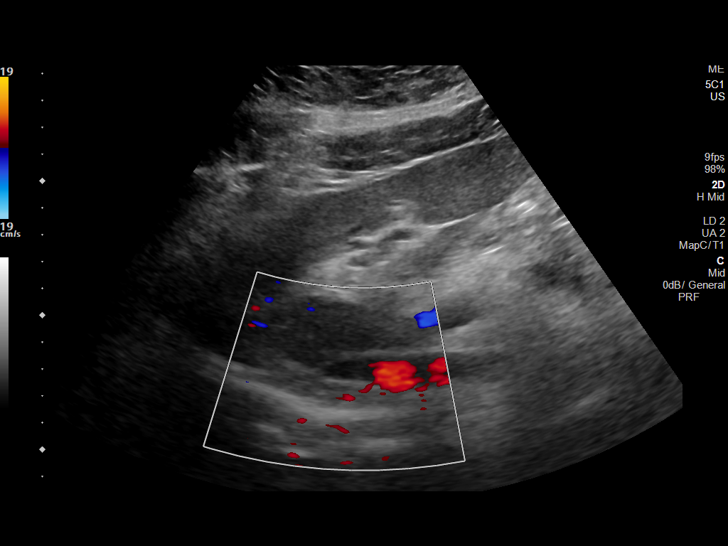
[im 28/61]
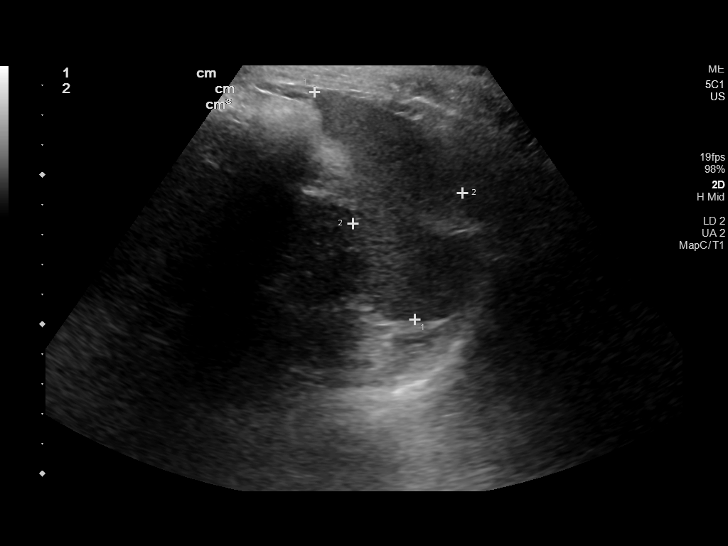
[im 33/61]
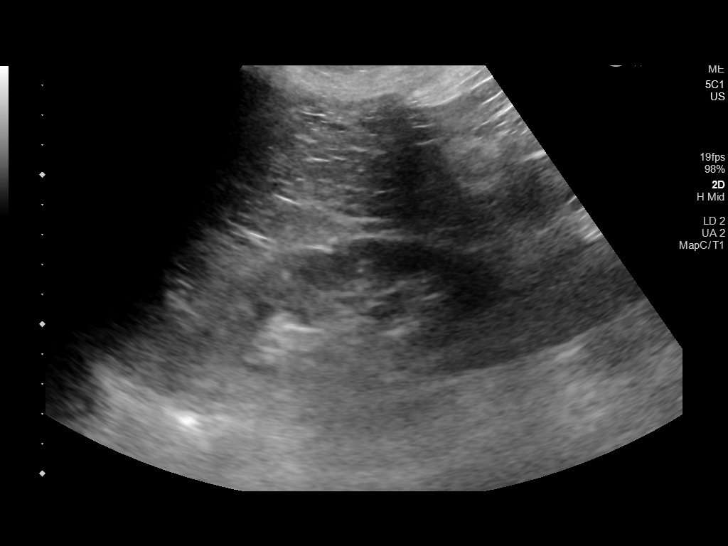
[im 38/61]
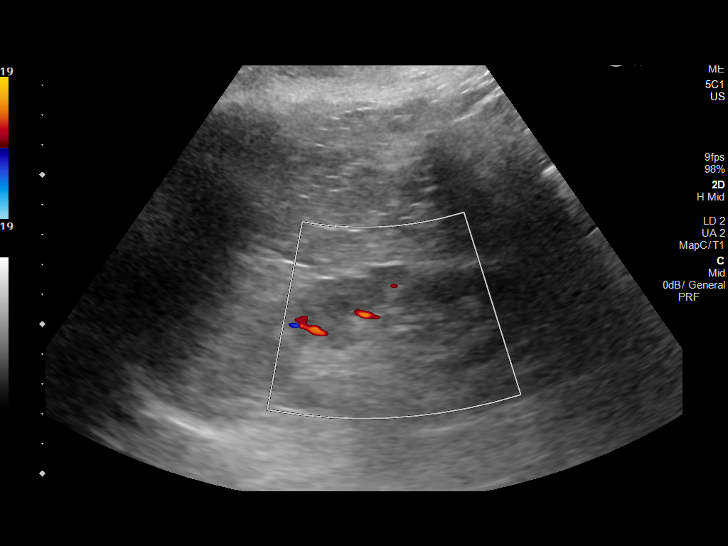
[im 41/61]
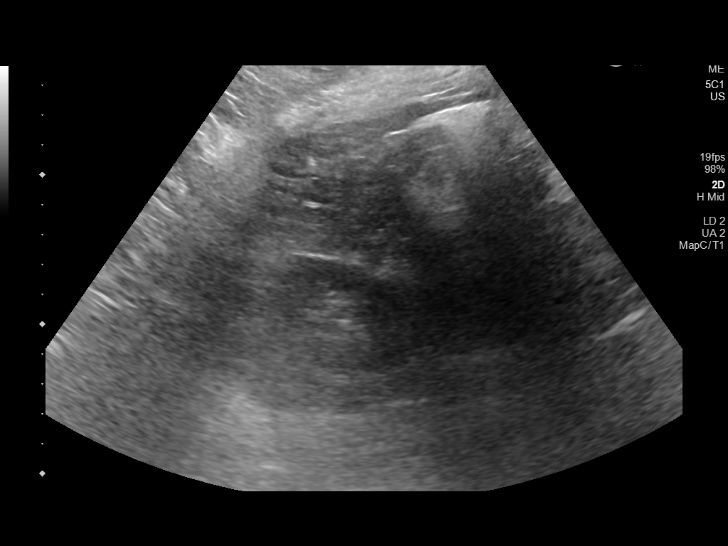
[im 46/61]
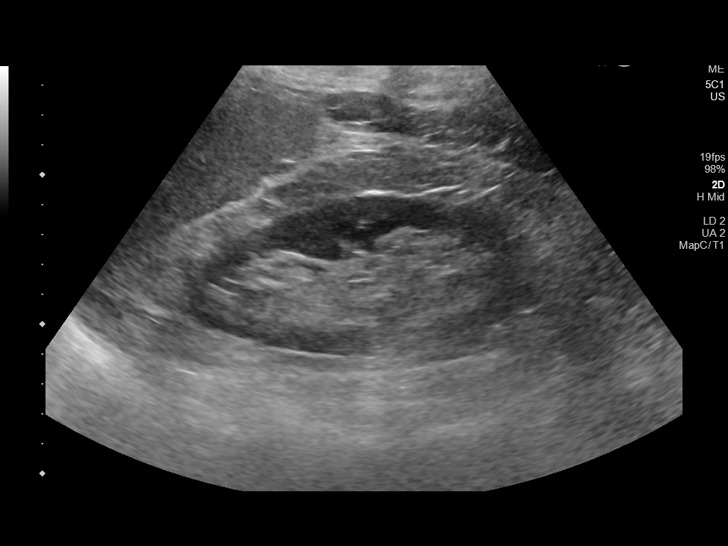
[im 51/61]
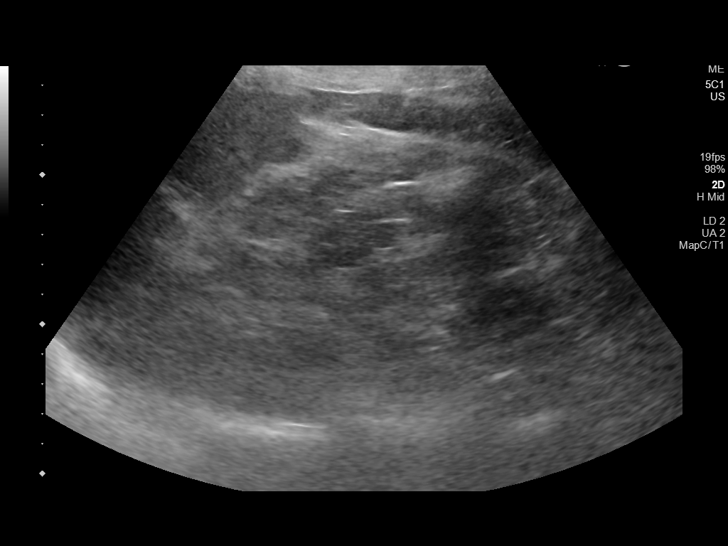
[im 56/61]
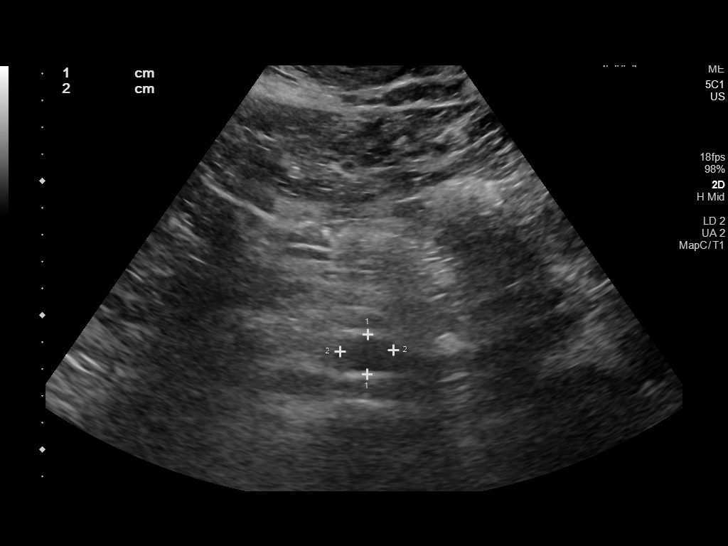
[im 61/61]
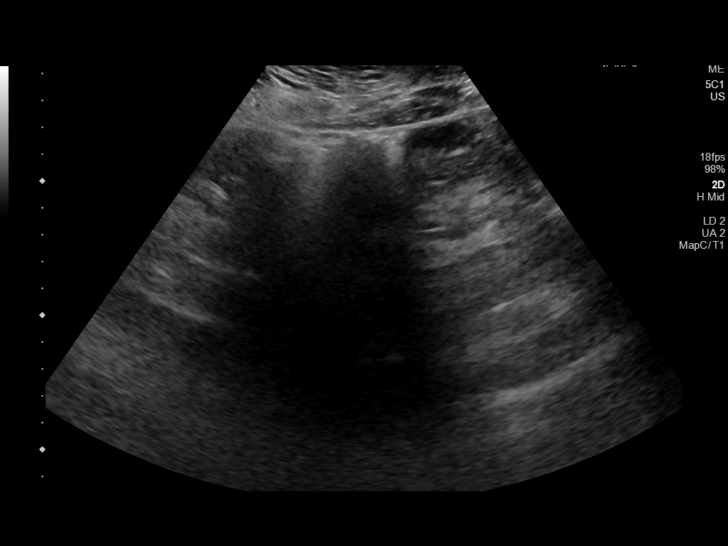

[14 of 25 positions shown; findings below may reference images not displayed]

FINDINGS: Gallbladder: Status post cholecystectomy.

Common bile duct: Diameter: 4 mm which is within normal limits.

Liver: No focal lesion identified. Within normal limits in
parenchymal echogenicity. Portal vein is patent on color Doppler
imaging with normal direction of blood flow towards the liver.

IVC: No abnormality visualized.

Pancreas: Visualized portion unremarkable.

Spleen: Size and appearance within normal limits.

Right Kidney: Length: 9.9 cm. Echogenicity within normal limits. No
mass or hydronephrosis visualized.

Left Kidney: Length: 11.9 cm. Echogenicity within normal limits. No
mass or hydronephrosis visualized.

Abdominal aorta: No aneurysm visualized.

Other findings: None.
IMPRESSION: Status post cholecystectomy. No other abnormality seen in the
abdomen.

## 2020-09-15 DIAGNOSIS — H2513 Age-related nuclear cataract, bilateral: Secondary | ICD-10-CM | POA: Diagnosis not present

## 2020-09-19 NOTE — Progress Notes (Signed)
[place]   [phone] [phone]   Clinic Follow up Note   Patient Care Team: Merwyn Katos as PCP - General (Physician Assistant) Sanda Klein, MD as PCP - Cardiology (Cardiology)  Date of Service:  09/24/2020  CHIEF COMPLAINT: F/u of Thrombocytopenia.   CURRENT THERAPY:  -Monthly B12 injection starting 07/23/20, increased to weekly for 4-6 weeks  -Oral B12 1021mcg once daily starting in 09/2020  INTERVAL HISTORY:  Meghan Griffith is here for a follow up of thrombocytopenia. She presents to the clinic alone. She is clinically doing well. I reviewed her medication list, she started new coritsan for HTN. She denies recent bleeding. She notes her energy has improved on B12 injections.     REVIEW OF SYSTEMS:   Constitutional: Denies fevers, chills or abnormal weight loss Eyes: Denies blurriness of vision Ears, nose, mouth, throat, and face: Denies mucositis or sore throat Respiratory: Denies cough, dyspnea or wheezes Cardiovascular: Denies palpitation, chest discomfort or lower extremity swelling Gastrointestinal:  Denies nausea, heartburn or change in bowel habits Skin: Denies abnormal skin rashes Lymphatics: Denies new lymphadenopathy or easy bruising Neurological:Denies numbness, tingling or new weaknesses Behavioral/Psych: Mood is stable, no new changes  All other systems were reviewed with the patient and are negative.  MEDICAL HISTORY:  Past Medical History:  Diagnosis Date  . Arthritis   . Cataracts, bilateral   . Cholelithiasis   . Chronic headache   . Incontinence   . Lower leg edema   . Phlebitis   . PVC (premature ventricular contraction)   . SOB (shortness of breath)   . Varicose veins   . Venous insufficiency (chronic) (peripheral)     SURGICAL HISTORY: Past Surgical History:  Procedure Laterality Date  . CHOLECYSTECTOMY    . collagen     injections  . ENDOVENOUS ABLATION SAPHENOUS VEIN W/  LASER Right 02-28-2014   right greater saphenous vein and stab phlebectomy 10-20 incisions right leg  by Curt Jews MD  . ENDOVENOUS ABLATION SAPHENOUS VEIN W/ LASER Left 07-18-2014   EVLA left greater saphenous vein, stab phlebectomy 10-20 incisions left leg , and sclerotherapy left leg    . ENDOVENOUS ABLATION SAPHENOUS VEIN W/ LASER Left 08-01-2014   EVLA  left small saphenous vein  by Curt Jews MD  . HERNIA REPAIR    . INCISIONAL HERNIA REPAIR N/A 10/26/2013   Procedure: LAPAROSCOPIC INCISIONAL HERNIA;  Surgeon: Gayland Curry, MD;  Location: [facility];  Service: General;  Laterality: N/A;  . INSERTION OF MESH N/A 10/26/2013   Procedure: INSERTION OF MESH;  Surgeon: Gayland Curry, MD;  Location: [facility];  Service: General;  Laterality: N/A;  . KNEE ARTHROSCOPY  2002   left knee  . LAPAROSCOPIC CHOLECYSTECTOMY W/ CHOLANGIOGRAPHY  07/30/11  . NOSE SURGERY    . TUBAL LIGATION  1976  . vaginal sling      I have reviewed the social history and family history with the patient and they are unchanged from previous note.  ALLERGIES:  is allergic to erythromycin base, lansoprazole, prednisone, and tetracyclines & related.  MEDICATIONS:  Current Outpatient Medications  Medication Sig Dispense Refill  . albuterol (VENTOLIN HFA) 108 (90 Base) MCG/ACT inhaler INHALE 1-2 PUFFS INTO THE LUNGS EVERY 6 (SIX) HOURS AS NEEDED FOR WHEEZING OR SHORTNESS OF BREATH. 6.7 g 11  . atorvastatin (LIPITOR) 40 MG tablet TAKE 1 TABLET BY MOUTH EVERY DAY AT 6PM 90 tablet 3  . Famotidine (PEPCID PO) Take by  mouth.    . furosemide (LASIX) 40 MG tablet TAKE 1 TABLET BY MOUTH 3 TIMES A WEEK ON MON/WED/FRI 39 tablet 3  . losartan (COZAAR) 50 MG tablet Take 1 tablet (50 mg total) by mouth daily. 30 tablet 5   No current facility-administered medications for this visit.    PHYSICAL EXAMINATION: ECOG PERFORMANCE STATUS: 1 - Symptomatic but completely ambulatory  Vitals:   09/24/20 1059  BP: 125/84  Pulse: 80  Resp:  18  Temp: 97.8 F (36.6 C)  SpO2: 99%   Filed Weights   09/24/20 1059  Weight: 242 lb 12.8 oz (110.1 kg)    Due to COVID19 we will limit examination to appearance. Patient had no complaints.  GENERAL:alert, no distress and comfortable SKIN: skin color normal, no rashes or significant lesions EYES: normal, Conjunctiva are pink and non-injected, sclera clear  NEURO: alert & oriented x 3 with fluent speech   LABORATORY DATA:  I have reviewed the data as listed CBC Latest Ref Rng & Units 09/24/2020 07/11/2020 06/30/2020  WBC 4.0 - 10.5 K/uL 4.5 5.5 2.8(L)  Hemoglobin 12.0 - 15.0 g/dL 14.6 14.7 15.6  Hematocrit 36 - 46 % 45.7 45.3 47.4(H)  Platelets 150 - 400 K/uL 101(L) 123(L) 100(LL)     CMP Latest Ref Rng & Units 06/30/2020 06/15/2019 12/21/2017  Glucose 65 - 99 mg/dL 101(H) 93 104(H)  BUN 8 - 27 mg/dL $Remove'16 13 12  'EiwYihz$ Creatinine 0.57 - 1.00 mg/dL 0.85 0.83 1.01(H)  Sodium 134 - 144 mmol/L 143 139 141  Potassium 3.5 - 5.2 mmol/L 4.2 4.0 4.2  Chloride 96 - 106 mmol/L 106 102 105  CO2 20 - 29 mmol/L $RemoveB'21 20 27  'vTFsbGwE$ Calcium 8.7 - 10.3 mg/dL 9.3 9.4 9.6  Total Protein 6.0 - 8.5 g/dL 6.8 7.0 7.1  Total Bilirubin 0.0 - 1.2 mg/dL 0.7 0.8 0.9  Alkaline Phos 48 - 121 IU/L 113 132(H) -  AST 0 - 40 IU/L $Remov'20 27 23  'FfisqW$ ALT 0 - 32 IU/L $Remov'13 19 20    'kdqYXM$ US Abdomen 08/12/20  IMPRESSION: Status post cholecystectomy. No other abnormality seen in the abdomen.   RADIOGRAPHIC STUDIES: I have personally reviewed the radiological images as listed and agreed with the findings in the report. No results found.   ASSESSMENT & PLAN:  Meghan Griffith is a 74 y.o. female with    1. Chronic mild thrombocytopenia and B12 deficiency  -She has had mildly low platelet count as early as 2009 and has been intermittent since then with plt in 100-150K range. Her 06/30/20 labs showed WBC 2.8, plt 100K. WBC were normal before. -Although her Hg is normal it has been elevated in the past, likely related to her H/o smoking.    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 I have very low suspicion of this being related to bone marrow disease so will hold on bone marrow biopsy.  -Her 08/12/20 US Abdomen normal. Her 07/11/20 labs work up showed plt still slightly low, WBC normal, no hepatitis, B12 level is low (1560 which could cause low plt. 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  -I started her on monthly B12 injections on 07/23/20.  -Her energy has improved but her plt is still low at 101K today. B12 level is still pending. Proceed with B12 injection today.  -I recommend increasing B12 injections to weekly with her PCP for  4-6 weeks, then continuing monthly. I also encouraged her to start oral B12 101mcg daily. She is agreeable as she is not interested in administering her injections at home.  -f/u in 1 year.   2. Comorbidities: Chronic right heart failure, Coronary artery calcifications, COPD, Acid Reflux -She is being followed by Cardiologist Dr Dani Gobble Croitoru, on medications. She has declined receiving Vaccines.  -She has had mammogram before but does not continue yearly. I discussed risk of right breast cancer and yearly screening for early breast cancer detection. She will think about it.    PLAN:  -Lab today for intrinsic factor -Proceed with B12 injection today  -B12 level today 248, still low, I recommend B12 injection at PCP office weekly for 4-6 weeks then monthly  -lab and B12 level at PCP office every 3-4 months -will fax my note and recommendation to her PCP Ms Jimmye Norman   -Start oral B12 1066mcg daily  -Lab and F/u in 1 year     No problem-specific Assessment & Plan notes found for this encounter.   No orders of the defined types were placed in this encounter.  All questions were answered. The patient knows to call the clinic with any problems, questions or concerns. No barriers to learning was  detected. The total time spent in the appointment was 25 minutes.     Truitt Merle, MD 09/24/2020   I, Joslyn Devon, am acting as scribe for Truitt Merle, MD.   I have reviewed the above documentation for accuracy and completeness, and I agree with the above.

## 2020-09-23 DIAGNOSIS — D696 Thrombocytopenia, unspecified: Secondary | ICD-10-CM | POA: Insufficient documentation

## 2020-09-24 ENCOUNTER — Inpatient Hospital Stay: Payer: PPO | Admitting: Hematology

## 2020-09-24 ENCOUNTER — Inpatient Hospital Stay: Payer: PPO | Attending: Hematology

## 2020-09-24 ENCOUNTER — Inpatient Hospital Stay: Payer: PPO

## 2020-09-24 ENCOUNTER — Encounter: Payer: Self-pay | Admitting: Hematology

## 2020-09-24 ENCOUNTER — Other Ambulatory Visit: Payer: Self-pay

## 2020-09-24 VITALS — BP 125/84 | HR 80 | Temp 97.8°F | Resp 18 | Ht 66.0 in | Wt 242.8 lb

## 2020-09-24 DIAGNOSIS — J449 Chronic obstructive pulmonary disease, unspecified: Secondary | ICD-10-CM | POA: Insufficient documentation

## 2020-09-24 DIAGNOSIS — D696 Thrombocytopenia, unspecified: Secondary | ICD-10-CM | POA: Insufficient documentation

## 2020-09-24 DIAGNOSIS — E538 Deficiency of other specified B group vitamins: Secondary | ICD-10-CM | POA: Diagnosis not present

## 2020-09-24 DIAGNOSIS — K219 Gastro-esophageal reflux disease without esophagitis: Secondary | ICD-10-CM | POA: Diagnosis not present

## 2020-09-24 DIAGNOSIS — I110 Hypertensive heart disease with heart failure: Secondary | ICD-10-CM | POA: Diagnosis not present

## 2020-09-24 DIAGNOSIS — I50812 Chronic right heart failure: Secondary | ICD-10-CM | POA: Insufficient documentation

## 2020-09-24 DIAGNOSIS — I2510 Atherosclerotic heart disease of native coronary artery without angina pectoris: Secondary | ICD-10-CM | POA: Insufficient documentation

## 2020-09-24 LAB — CBC WITH DIFFERENTIAL (CANCER CENTER ONLY)
Abs Immature Granulocytes: 0.01 10*3/uL (ref 0.00–0.07)
Basophils Absolute: 0.1 10*3/uL (ref 0.0–0.1)
Basophils Relative: 1 %
Eosinophils Absolute: 0.1 10*3/uL (ref 0.0–0.5)
Eosinophils Relative: 2 %
HCT: 45.7 % (ref 36.0–46.0)
Hemoglobin: 14.6 g/dL (ref 12.0–15.0)
Immature Granulocytes: 0 %
Lymphocytes Relative: 26 %
Lymphs Abs: 1.2 10*3/uL (ref 0.7–4.0)
MCH: 28.2 pg (ref 26.0–34.0)
MCHC: 31.9 g/dL (ref 30.0–36.0)
MCV: 88.4 fL (ref 80.0–100.0)
Monocytes Absolute: 0.4 10*3/uL (ref 0.1–1.0)
Monocytes Relative: 9 %
Neutro Abs: 2.8 10*3/uL (ref 1.7–7.7)
Neutrophils Relative %: 62 %
Platelet Count: 101 10*3/uL — ABNORMAL LOW (ref 150–400)
RBC: 5.17 MIL/uL — ABNORMAL HIGH (ref 3.87–5.11)
RDW: 13.2 % (ref 11.5–15.5)
WBC Count: 4.5 10*3/uL (ref 4.0–10.5)
nRBC: 0 % (ref 0.0–0.2)

## 2020-09-24 LAB — VITAMIN B12: Vitamin B-12: 248 pg/mL (ref 180–914)

## 2020-09-24 MED ORDER — CYANOCOBALAMIN 1000 MCG/ML IJ SOLN
1000.0000 ug | Freq: Once | INTRAMUSCULAR | Status: AC
  Administered 2020-09-24: 1000 ug via INTRAMUSCULAR

## 2020-09-25 ENCOUNTER — Telehealth: Payer: Self-pay

## 2020-09-25 LAB — INTRINSIC FACTOR ANTIBODIES: Intrinsic Factor: 16.9 AU/mL — ABNORMAL HIGH (ref 0.0–1.1)

## 2020-09-30 ENCOUNTER — Telehealth: Payer: Self-pay

## 2020-09-30 NOTE — Telephone Encounter (Signed)
Spoke with pt concerning B12 levels (pernicious anemia) pt states she did speak with provider concerning this and provider to follow up with PCP as pt states she wants to receive her injections closer to home PCP B.Williams officed made aware and are to f/u with patient

## 2020-10-03 NOTE — Telephone Encounter (Signed)
error 

## 2020-10-06 ENCOUNTER — Other Ambulatory Visit: Payer: Self-pay

## 2020-10-06 ENCOUNTER — Encounter: Payer: Self-pay | Admitting: Pharmacist

## 2020-10-06 ENCOUNTER — Ambulatory Visit (INDEPENDENT_AMBULATORY_CARE_PROVIDER_SITE_OTHER): Payer: PPO | Admitting: Pharmacist

## 2020-10-06 VITALS — BP 126/84 | HR 92 | Resp 15 | Ht 66.0 in | Wt 243.2 lb

## 2020-10-06 DIAGNOSIS — Z79899 Other long term (current) drug therapy: Secondary | ICD-10-CM | POA: Diagnosis not present

## 2020-10-06 DIAGNOSIS — I10 Essential (primary) hypertension: Secondary | ICD-10-CM

## 2020-10-06 NOTE — Patient Instructions (Addendum)
Return for a follow up appointment in 5-6 weeks  Go to the lab in TODAY  Check your blood pressure at home daily (if able) and keep record of the readings.  Take your BP meds as follows: *NO CHANGES*  *Try changing cyanocobalamin to HYDROXYCOBALAMIN, if possible, to increase tolerabiility.  Bring all of your meds, your BP cuff and your record of home blood pressures to your next appointment.  Exercise as you're able, try to walk approximately 30 minutes per day.  Keep salt intake to a minimum, especially watch canned and prepared boxed foods.  Eat more fresh fruits and vegetables and fewer canned items.  Avoid eating in fast food restaurants.    HOW TO TAKE YOUR BLOOD PRESSURE: . Rest 5 minutes before taking your blood pressure. .  Don't smoke or drink caffeinated beverages for at least 30 minutes before. . Take your blood pressure before (not after) you eat. . Sit comfortably with your back supported and both feet on the floor (don't cross your legs). . Elevate your arm to heart level on a table or a desk. . Use the proper sized cuff. It should fit smoothly and snugly around your bare upper arm. There should be enough room to slip a fingertip under the cuff. The bottom edge of the cuff should be 1 inch above the crease of the elbow. . Ideally, take 3 measurements at one sitting and record the average.

## 2020-10-06 NOTE — Progress Notes (Signed)
Patient ID: ABRI VACCA                 DOB: [birth year]                      MRN: [id number]     HPI: Meghan Griffith is a 74 y.o. female referred by Dr. Royann Shivers to HTN clinic. PMH includes CAD, hyperlipidemia, elevated BP, HF, and COPD. Last OV with Dr Royann Shivers was on 9/30/.2021, and BP was 148/92. Losartan 50mg  daiy was initiated during appointment and follow up was schedule to assess therapy response, and possible titration. One of her most pressing concerns is issues with headaches after every Vitamin injection. Patient denies chest pain, swelling, HA any other time, blurry vision, or dizziness. Noted repeat BMET since adding losartan to therapy was not completed.  Current HTN meds:  Losartan 50mg  daily (6pm) Furosemide 40mg  Mo/Wed/Fri - taking as needed (last dose > 1 week ago)  BP goal: <130/80  Family History: The patient's family history includes COPD in her father, mother, and sister; Cancer in her sister; Coronary artery disease in an other family member; Diabetes in her brother and mother; Heart disease in her brother, father, mother, and sister; Hypertension in her brother, father, mother, sister, and another family member; Other in her mother; Rheum arthritis in her mother.   Social History: former smoker, no alcohol  Diet: no take out, food lion, no meat,   Exercise: activities of daily living  Home BP readings:  None available, she is not checking BP at home and don't have BP cuff. Patient is willing to get BP cuff.  Wt Readings from Last 3 Encounters:  10/06/20 243 lb 3.2 oz (110.3 kg)  09/24/20 242 lb 12.8 oz (110.1 kg)  09/04/20 247 lb (112 kg)   BP Readings from Last 3 Encounters:  10/06/20 126/84  09/24/20 125/84  09/04/20 (!) 148/92   Pulse Readings from Last 3 Encounters:  10/06/20 92  09/24/20 80  09/04/20 72    Renal function: Estimated Creatinine Clearance: 67.6 mL/min (by C-G formula based on SCr of 0.96 mg/dL).  Past Medical History:   Diagnosis Date   Arthritis    Cataracts, bilateral    Cholelithiasis    Chronic headache    Incontinence    Lower leg edema    Phlebitis    PVC (premature ventricular contraction)    SOB (shortness of breath)    Varicose veins    Venous insufficiency (chronic) (peripheral)     Current Outpatient Medications on File Prior to Visit  Medication Sig Dispense Refill   albuterol (VENTOLIN HFA) 108 (90 Base) MCG/ACT inhaler INHALE 1-2 PUFFS INTO THE LUNGS EVERY 6 (SIX) HOURS AS NEEDED FOR WHEEZING OR SHORTNESS OF BREATH. 6.7 g 11   atorvastatin (LIPITOR) 40 MG tablet TAKE 1 TABLET BY MOUTH EVERY DAY AT 6PM 90 tablet 3   Famotidine (PEPCID PO) Take by mouth.     furosemide (LASIX) 40 MG tablet TAKE 1 TABLET BY MOUTH 3 TIMES A WEEK ON MON/WED/FRI 39 tablet 3   losartan (COZAAR) 50 MG tablet Take 1 tablet (50 mg total) by mouth daily. 30 tablet 5   No current facility-administered medications on file prior to visit.    Allergies  Allergen Reactions   Erythromycin Base Other (See Comments)    Not known   Lansoprazole Other (See Comments)    GI upset   Prednisone Rash and Other (See Comments)    Flushing/redness  in face   Tetracyclines & Related Other (See Comments)    Causes severe abdominal pain    Blood pressure 126/84, pulse 92, resp. rate 15, height 5\' 6"  (1.676 m), weight 243 lb 3.2 oz (110.3 kg), SpO2 96 %.  Hypertension Blood pressure greatly improved since adding losartan to therapy. Noted repeat BMET was not done to assess renal function and electrolytes status. We can increase losartan to improve diastolic pressure, but will wait until more information is available.  Patient is to repeat BMET today, work on decreasing dietary sodium, and increasing physical activity. She is planning to get home BP cuff as soon as possible.  Will continue all medication as previously prescribed, and follow up in 4 weeks.    Joellen Tullos Rodriguez-Guzman PharmD, BCPS,  CPP [facility] [address] [place] 10/07/2020 3:43 PM

## 2020-10-07 ENCOUNTER — Encounter: Payer: Self-pay | Admitting: Pharmacist

## 2020-10-07 ENCOUNTER — Encounter: Payer: Self-pay | Admitting: *Deleted

## 2020-10-07 DIAGNOSIS — I10 Essential (primary) hypertension: Secondary | ICD-10-CM | POA: Insufficient documentation

## 2020-10-07 LAB — BASIC METABOLIC PANEL
BUN/Creatinine Ratio: 14 (ref 12–28)
BUN: 13 mg/dL (ref 8–27)
CO2: 21 mmol/L (ref 20–29)
Calcium: 9.5 mg/dL (ref 8.7–10.3)
Chloride: 104 mmol/L (ref 96–106)
Creatinine, Ser: 0.96 mg/dL (ref 0.57–1.00)
GFR calc Af Amer: 69 mL/min/{1.73_m2} (ref >59)
GFR calc non Af Amer: 60 mL/min/{1.73_m2} (ref >59)
Glucose: 81 mg/dL (ref 65–99)
Potassium: 4.1 mmol/L (ref 3.5–5.2)
Sodium: 141 mmol/L (ref 134–144)

## 2020-10-07 NOTE — Assessment & Plan Note (Signed)
Blood pressure greatly improved since adding losartan to therapy. Noted repeat BMET was not done to assess renal function and electrolytes status. We can increase losartan to improve diastolic pressure, but will wait until more information is available.  Patient is to repeat BMET today, work on decreasing dietary sodium, and increasing physical activity. She is planning to get home BP cuff as soon as possible.  Will continue all medication as previously prescribed, and follow up in 4 weeks.

## 2020-11-11 ENCOUNTER — Other Ambulatory Visit: Payer: Self-pay

## 2020-11-11 ENCOUNTER — Ambulatory Visit (INDEPENDENT_AMBULATORY_CARE_PROVIDER_SITE_OTHER): Payer: PPO | Admitting: Pharmacist

## 2020-11-11 ENCOUNTER — Encounter: Payer: Self-pay | Admitting: Pharmacist

## 2020-11-11 VITALS — BP 136/90 | HR 108 | Resp 16 | Ht 66.0 in | Wt 242.8 lb

## 2020-11-11 DIAGNOSIS — I10 Essential (primary) hypertension: Secondary | ICD-10-CM | POA: Diagnosis not present

## 2020-11-11 MED ORDER — OLMESARTAN MEDOXOMIL 40 MG PO TABS: 40.0000 mg | ORAL_TABLET | Freq: Every day | ORAL | 1 refills | Status: DC

## 2020-11-11 NOTE — Progress Notes (Signed)
Patient ID: [name]                 DOB: Jul 14, 1946                      MRN: [id number]     HPI: Meghan Griffith is a 74 y.o. female referred by Dr. Royann Shivers to HTN clinic. PMH includes CAD, hyperlipidemia, elevated BP, HF, and COPD. Her most pressing concerns is issues with headaches after every cyanocobalamine injection. Patient denies chest pain, swelling, HA any other time, blurry vision, or dizziness. Repeat BMET shows stable renal function and electrolytes.   Current HTN meds:  Losartan 50mg  daily (6pm) Furosemide 40mg  Mo/Wed/Fri - taking as needed (last dose > 1 week ago)  BP goal: <130/80  Family History: The patient's family history includes COPD in her father, mother, and sister; Cancer in her sister; Coronary artery disease in an other family member; Diabetes in her brother and mother; Heart disease in her brother, father, mother, and sister; Hypertension in her brother, father, mother, sister, and another family member; Other in her mother; Rheum arthritis in her mother.   Social History: former smoker, no alcohol  Diet: no take out, food lion, no meat  Exercise: activities of daily living, no excercise  Home BP readings:  None available, she is not checking BP at home and don't have BP cuff.   Wt Readings from Last 3 Encounters:  11/11/20 242 lb 12.8 oz (110.1 kg)  10/06/20 243 lb 3.2 oz (110.3 kg)  09/24/20 242 lb 12.8 oz (110.1 kg)   BP Readings from Last 3 Encounters:  11/11/20 136/90  10/06/20 126/84  09/24/20 125/84   Pulse Readings from Last 3 Encounters:  11/11/20 (!) 108  10/06/20 92  09/24/20 80    Past Medical History:  Diagnosis Date  . Arthritis   . Cataracts, bilateral   . Cholelithiasis   . Chronic headache   . Incontinence   . Lower leg edema   . Phlebitis   . PVC (premature ventricular contraction)   . SOB (shortness of breath)   . Varicose veins   . Venous insufficiency (chronic) (peripheral)     Current Outpatient  Medications on File Prior to Visit  Medication Sig Dispense Refill  . albuterol (VENTOLIN HFA) 108 (90 Base) MCG/ACT inhaler INHALE 1-2 PUFFS INTO THE LUNGS EVERY 6 (SIX) HOURS AS NEEDED FOR WHEEZING OR SHORTNESS OF BREATH. 6.7 g 11  . atorvastatin (LIPITOR) 40 MG tablet TAKE 1 TABLET BY MOUTH EVERY DAY AT 6PM 90 tablet 3  . Famotidine (PEPCID PO) Take by mouth.    . furosemide (LASIX) 40 MG tablet TAKE 1 TABLET BY MOUTH 3 TIMES A WEEK ON MON/WED/FRI 39 tablet 3   No current facility-administered medications on file prior to visit.    Allergies  Allergen Reactions  . Erythromycin Base Other (See Comments)    Not known  . Lansoprazole Other (See Comments)    GI upset  . Prednisone Rash and Other (See Comments)    Flushing/redness in face  . Tetracyclines & Related Other (See Comments)    Causes severe abdominal pain    Blood pressure 136/90, pulse (!) 108, resp. rate 16, height 5\' 6"  (1.676 m), weight 242 lb 12.8 oz (110.1 kg), SpO2 97 %.  Hypertension Blood pressure remains above goal. Patient denies problems with current therapy, but still experiencing occasional headaches. Will discontinue losartan therapy, and start olmesartan 40mg  daily. Plan to follow  up in 4 weeks for additional titration.    Ayce Pietrzyk Rodriguez-Guzman PharmD, BCPS, CPP [facility] [address] [place] [address] 11/14/2020 3:40 PM

## 2020-11-11 NOTE — Patient Instructions (Addendum)
Return for a follow up appointment 5 weeks  Check your blood pressure at home daily (if able) and keep record of the readings.  Take your BP meds as follows: *STOP TAKING LOSARTAN 50MG * *START TAKING OLMESARTAN 40MG  DAILY*  Bring all of your meds, your BP cuff and your record of home blood pressures to your next appointment.  Exercise as you're able, try to walk approximately 30 minutes per day.  Keep salt intake to a minimum, especially watch canned and prepared boxed foods.  Eat more fresh fruits and vegetables and fewer canned items.  Avoid eating in fast food restaurants.    HOW TO TAKE YOUR BLOOD PRESSURE: . Rest 5 minutes before taking your blood pressure. .  Don't smoke or drink caffeinated beverages for at least 30 minutes before. . Take your blood pressure before (not after) you eat. . Sit comfortably with your back supported and both feet on the floor (don't cross your legs). . Elevate your arm to heart level on a table or a desk. . Use the proper sized cuff. It should fit smoothly and snugly around your bare upper arm. There should be enough room to slip a fingertip under the cuff. The bottom edge of the cuff should be 1 inch above the crease of the elbow. . Ideally, take 3 measurements at one sitting and record the average.

## 2020-11-13 ENCOUNTER — Telehealth: Payer: Self-pay

## 2020-11-13 NOTE — Telephone Encounter (Signed)
Meghan Griffith called stating vitamin B12 gives her headaches and she is no longer taking.

## 2020-11-14 NOTE — Assessment & Plan Note (Signed)
Blood pressure remains above goal. Patient denies problems with current therapy, but still experiencing occasional headaches. Will discontinue losartan therapy, and start olmesartan 40mg  daily. Plan to follow up in 4 weeks for additional titration.

## 2020-12-03 ENCOUNTER — Other Ambulatory Visit: Payer: Self-pay | Admitting: Cardiovascular Disease

## 2020-12-16 ENCOUNTER — Other Ambulatory Visit: Payer: Self-pay

## 2020-12-16 ENCOUNTER — Ambulatory Visit (INDEPENDENT_AMBULATORY_CARE_PROVIDER_SITE_OTHER): Payer: PPO | Admitting: Pharmacist Clinician (PhC)/ Clinical Pharmacy Specialist

## 2020-12-16 DIAGNOSIS — I10 Essential (primary) hypertension: Secondary | ICD-10-CM | POA: Diagnosis not present

## 2020-12-16 NOTE — Patient Instructions (Addendum)
  Check your blood pressure at home 3-4 times each week and keep record of the readings.  Take your BP meds as follows:  Continue with all current medications  Bring all of your meds, your BP cuff and your record of home blood pressures to your next appointment.  Exercise as you're able, try to walk approximately 30 minutes per day.  Keep salt intake to a minimum, especially watch canned and prepared boxed foods.  Eat more fresh fruits and vegetables and fewer canned items.  Avoid eating in fast food restaurants.    HOW TO TAKE YOUR BLOOD PRESSURE: . Rest 5 minutes before taking your blood pressure. .  Don't smoke or drink caffeinated beverages for at least 30 minutes before. . Take your blood pressure before (not after) you eat. . Sit comfortably with your back supported and both feet on the floor (don't cross your legs). . Elevate your arm to heart level on a table or a desk. . Use the proper sized cuff. It should fit smoothly and snugly around your bare upper arm. There should be enough room to slip a fingertip under the cuff. The bottom edge of the cuff should be 1 inch above the crease of the elbow. . Ideally, take 3 measurements at one sitting and record the average.    

## 2020-12-16 NOTE — Progress Notes (Signed)
Patient ID: Meghan Griffith                 DOB: November 01, 1946                      MRN: [id number]     HPI: Meghan Griffith is a 75 y.o. female referred by Dr. Royann Shivers to HTN clinic. PMH includes CAD, hyperlipidemia, elevated BP, HF, and COPD. Her most pressing concerns is issues with headaches after every cyanocobalamine injection. Patient denies chest pain, swelling, HA any other time, blurry vision, or dizziness. Repeat BMET shows stable renal function and electrolytes.   At her last visit with Raquel Penni Homans, her pressure was noted to be 136/90 and her losartan was switched to olmesartan 40 mg daily.    She returns today for follow up.  She doesn't have any home readings with her today, however she states all readings were in the 130/80 range.  She had no specific numbers.   Current HTN meds:  Olmesartan 40 mg qd Furosemide 40mg  prn  BP goal: <130/80  Family History: The patient's family history includes COPD in her father, mother, and sister; Cancer in her sister; Coronary artery disease in an other family member; Diabetes in her brother and mother; Heart disease in her brother, father, mother, and sister; Hypertension in her brother, father, mother, sister, and another family member; Rheum arthritis in her mother.   Social History: former smoker, no alcohol;  coffee 2 large [employer]  Diet: Does not eat meats, uses protein drinks to get protein (no nuts, beans or soy), uses canned vegetables - low/no sodium; likes baked goods (cakes and pies) - gets from local grocery store  Exercise: activities of daily living, no excercise  Home BP readings: recently bought home cuff, brand unknown.  States all readings in the 120/80 range with no specific numbers.   Wt Readings from Last 3 Encounters:  12/16/20 243 lb (110.2 kg)  11/11/20 242 lb 12.8 oz (110.1 kg)  10/06/20 243 lb 3.2 oz (110.3 kg)   BP Readings from Last 3 Encounters:  12/16/20 126/82  11/11/20 136/90   10/06/20 126/84   Pulse Readings from Last 3 Encounters:  12/16/20 80  11/11/20 (!) 108  10/06/20 92    Past Medical History:  Diagnosis Date  . Arthritis   . Cataracts, bilateral   . Cholelithiasis   . Chronic headache   . Incontinence   . Lower leg edema   . Phlebitis   . PVC (premature ventricular contraction)   . SOB (shortness of breath)   . Varicose veins   . Venous insufficiency (chronic) (peripheral)     Current Outpatient Medications on File Prior to Visit  Medication Sig Dispense Refill  . albuterol (VENTOLIN HFA) 108 (90 Base) MCG/ACT inhaler INHALE 1-2 PUFFS INTO THE LUNGS EVERY 6 (SIX) HOURS AS NEEDED FOR WHEEZING OR SHORTNESS OF BREATH. 6.7 g 11  . atorvastatin (LIPITOR) 40 MG tablet TAKE 1 TABLET BY MOUTH EVERY DAY AT 6PM 90 tablet 3  . Famotidine (PEPCID PO) Take by mouth.    . furosemide (LASIX) 40 MG tablet TAKE 1 TABLET BY MOUTH 3 TIMES A WEEK ON MON/WED/FRI 39 tablet 3  . olmesartan (BENICAR) 40 MG tablet TAKE 1 TABLET BY MOUTH EVERY DAY 30 tablet 1   No current facility-administered medications on file prior to visit.    Allergies  Allergen Reactions  . Erythromycin Base Other (See Comments)    Not known  .  Lansoprazole Other (See Comments)    GI upset  . Prednisone Rash and Other (See Comments)    Flushing/redness in face  . Tetracyclines & Related Other (See Comments)    Causes severe abdominal pain    Blood pressure 126/82, pulse 80, height 5\' 6"  (1.676 m), weight 243 lb (110.2 kg).  Hypertension Patient with essential hypertension, doing much better with switch from losartan to olmesartan. She was advised to continue with home BP monitoring and contact our office should she note home readings trend to 140 or greater consistently.      PharmD CPP [facility] [address] [place] 12/17/2020 9:48 AM

## 2020-12-17 ENCOUNTER — Encounter: Payer: Self-pay | Admitting: Pharmacist Clinician (PhC)/ Clinical Pharmacy Specialist

## 2020-12-17 NOTE — Assessment & Plan Note (Signed)
Patient with essential hypertension, doing much better with switch from losartan to olmesartan. She was advised to continue with home BP monitoring and contact our office should she note home readings trend to 140 or greater consistently.

## 2020-12-26 ENCOUNTER — Other Ambulatory Visit: Payer: Self-pay | Admitting: Cardiovascular Disease

## 2021-01-20 ENCOUNTER — Other Ambulatory Visit: Payer: Self-pay | Admitting: Cardiovascular Disease

## 2021-02-12 ENCOUNTER — Other Ambulatory Visit: Payer: Self-pay | Admitting: Cardiovascular Disease

## 2021-03-06 ENCOUNTER — Other Ambulatory Visit: Payer: Self-pay | Admitting: Cardiovascular Disease

## 2021-03-29 ENCOUNTER — Other Ambulatory Visit: Payer: Self-pay | Admitting: Cardiovascular Disease

## 2021-05-26 ENCOUNTER — Other Ambulatory Visit: Payer: Self-pay | Admitting: Cardiovascular Disease

## 2021-05-28 ENCOUNTER — Other Ambulatory Visit: Payer: Self-pay

## 2021-05-28 ENCOUNTER — Telehealth: Payer: Self-pay | Admitting: Cardiovascular Disease

## 2021-05-28 MED ORDER — ATORVASTATIN CALCIUM 40 MG PO TABS: ORAL_TABLET | ORAL | 0 refills | Status: DC

## 2021-05-28 NOTE — Telephone Encounter (Signed)
   *  STAT* If patient is at the pharmacy, call can be transferred to refill team.   1. Which medications need to be refilled? (please list name of each medication and dose if known) atorvastatin (LIPITOR) 40 MG tablet  2. Which pharmacy/location (including street and city if local pharmacy) is medication to be sent to? [id number] - [place], [place] - [address]  3. Do they need a 30 day or 90 day supply? 90 days

## 2021-05-28 NOTE — Telephone Encounter (Signed)
Refills sent to pharmacy. 

## 2021-06-03 ENCOUNTER — Other Ambulatory Visit: Payer: Self-pay | Admitting: Cardiovascular Disease

## 2021-09-17 ENCOUNTER — Other Ambulatory Visit: Payer: Self-pay | Admitting: Cardiovascular Disease

## 2021-09-23 ENCOUNTER — Ambulatory Visit: Payer: PPO | Admitting: Hematology

## 2021-09-23 ENCOUNTER — Other Ambulatory Visit: Payer: Self-pay | Admitting: Cardiovascular Disease

## 2021-09-23 ENCOUNTER — Other Ambulatory Visit: Payer: PPO

## 2021-09-25 ENCOUNTER — Telehealth: Payer: Self-pay

## 2021-09-25 DIAGNOSIS — Z79899 Other long term (current) drug therapy: Secondary | ICD-10-CM | POA: Diagnosis not present

## 2021-09-25 DIAGNOSIS — I10 Essential (primary) hypertension: Secondary | ICD-10-CM | POA: Diagnosis not present

## 2021-09-25 DIAGNOSIS — I50812 Chronic right heart failure: Secondary | ICD-10-CM | POA: Diagnosis not present

## 2021-09-25 DIAGNOSIS — E7800 Pure hypercholesterolemia, unspecified: Secondary | ICD-10-CM

## 2021-09-25 LAB — COMPREHENSIVE METABOLIC PANEL
ALT: 11 IU/L (ref 0–32)
AST: 18 IU/L (ref 0–40)
Albumin/Globulin Ratio: 1.8 (ref 1.2–2.2)
Albumin: 4.4 g/dL (ref 3.7–4.7)
Alkaline Phosphatase: 112 IU/L (ref 44–121)
BUN/Creatinine Ratio: 12 (ref 12–28)
BUN: 15 mg/dL (ref 8–27)
Bilirubin Total: 0.6 mg/dL (ref 0.0–1.2)
CO2: 21 mmol/L (ref 20–29)
Calcium: 9.3 mg/dL (ref 8.7–10.3)
Chloride: 106 mmol/L (ref 96–106)
Creatinine, Ser: 1.27 mg/dL — ABNORMAL HIGH (ref 0.57–1.00)
Globulin, Total: 2.4 g/dL (ref 1.5–4.5)
Glucose: 93 mg/dL (ref 70–99)
Potassium: 4.3 mmol/L (ref 3.5–5.2)
Sodium: 143 mmol/L (ref 134–144)
Total Protein: 6.8 g/dL (ref 6.0–8.5)
eGFR: 45 mL/min/{1.73_m2} — ABNORMAL LOW (ref >59)

## 2021-09-25 LAB — LIPID PANEL
Chol/HDL Ratio: 2.4 ratio (ref 0.0–4.4)
Cholesterol, Total: 131 mg/dL (ref 100–199)
HDL: 55 mg/dL (ref >39)
LDL Chol Calc (NIH): 62 mg/dL (ref 0–99)
Triglycerides: 71 mg/dL (ref 0–149)
VLDL Cholesterol Cal: 14 mg/dL (ref 5–40)

## 2021-09-25 NOTE — Telephone Encounter (Signed)
Pt walked in for labwork prior to her appt next month with Dr. Royann Shivers and she is fasting... I advised that there were no orders and she was very upset since she had not eaten and says this is normal for her to have labs piror to her appt and this was the best day for her to come in.   After talking with the Dr. Jens Som and his nurse and it had been determined that it was appropriate to draw Lipids, CMET, and Vit d levels on the pt based on her previous orders.   Orders placed.

## 2021-09-26 LAB — VITAMIN D 25 HYDROXY (VIT D DEFICIENCY, FRACTURES): Vit D, 25-Hydroxy: 21 ng/mL — ABNORMAL LOW (ref 30.0–100.0)

## 2021-09-30 ENCOUNTER — Telehealth: Payer: Self-pay | Admitting: Cardiovascular Disease

## 2021-09-30 NOTE — Telephone Encounter (Signed)
Patient was returning call for results. Please advise °

## 2021-09-30 NOTE — Telephone Encounter (Signed)
Patient made aware of results and verbalized understanding. Patient has a follow up with Dr. Royann Shivers next Friday 11/4.  Croitoru, Rachelle Hora, MD  Sandi Mariscal, RN Cholesterol looks good. Vit D a little low - please discuss w PCP.  Kidney tests mildly abnormal - may be taking a little more furosemide than necessary and a little "dry", perhaps?

## 2021-10-09 ENCOUNTER — Other Ambulatory Visit: Payer: Self-pay

## 2021-10-09 ENCOUNTER — Ambulatory Visit: Payer: PPO | Admitting: Cardiovascular Disease

## 2021-10-09 VITALS — BP 122/86 | HR 82 | Ht 66.0 in | Wt 244.2 lb

## 2021-10-09 DIAGNOSIS — I10 Essential (primary) hypertension: Secondary | ICD-10-CM

## 2021-10-09 DIAGNOSIS — J449 Chronic obstructive pulmonary disease, unspecified: Secondary | ICD-10-CM | POA: Diagnosis not present

## 2021-10-09 DIAGNOSIS — E7800 Pure hypercholesterolemia, unspecified: Secondary | ICD-10-CM

## 2021-10-09 DIAGNOSIS — I700 Atherosclerosis of aorta: Secondary | ICD-10-CM

## 2021-10-09 DIAGNOSIS — R7989 Other specified abnormal findings of blood chemistry: Secondary | ICD-10-CM | POA: Diagnosis not present

## 2021-10-09 DIAGNOSIS — I50812 Chronic right heart failure: Secondary | ICD-10-CM | POA: Diagnosis not present

## 2021-10-09 DIAGNOSIS — I2510 Atherosclerotic heart disease of native coronary artery without angina pectoris: Secondary | ICD-10-CM | POA: Diagnosis not present

## 2021-10-09 DIAGNOSIS — I2584 Coronary atherosclerosis due to calcified coronary lesion: Secondary | ICD-10-CM

## 2021-10-09 NOTE — Progress Notes (Signed)
Cardiology Office Note    Date:  10/10/2021   ID:  GERARDA Griffith, DOB 1945/12/25, MRN [id number]  PCP:  Roger Kill, PA-C  Cardiologist:   Thurmon Fair, MD  Referred by: [name], [facility]  Chief Complaint  Patient presents with   Coronary artery calcifications    History of Present Illness:  Meghan Griffith is a 75 y.o. female with extensive coronary calcifications on chest CT but with normal left ventricular systolic function, normal myocardial perfusion pattern and no history of angina.   She is doing well.  She occasionally takes furosemide for lower extremity edema, but this is less than once a month. The patient specifically denies any chest pain at rest exertion, dyspnea at rest or with exertion, orthopnea, paroxysmal nocturnal dyspnea, syncope, palpitations, focal neurological deficits, intermittent claudication, lower extremity edema, unexplained weight gain, cough, hemoptysis or wheezing.  Her blood pressure is consistently well controlled.  She is compliant with her antihypertensive medication and her statin.  Her ECG shows sinus rhythm with first-degree AV block and right bundle branch block, but she has no history of high-grade AV block or any ischemic repolarization abnormalities.  Her most recent LDL cholesterol was 62.  Her echo was interpreted as showing grade 1 diastolic dysfunction, but I think it was normal. Her lateral and medial diastolic annular velocities were 12 and 10 cm/second respectively, normal for her age.  Past Medical History:  Diagnosis Date   Arthritis    Cataracts, bilateral    Cholelithiasis    Chronic headache    Incontinence    Lower leg edema    Phlebitis    PVC (premature ventricular contraction)    SOB (shortness of breath)    Varicose veins    Venous insufficiency (chronic) (peripheral)     Past Surgical History:  Procedure Laterality Date   CHOLECYSTECTOMY     collagen     injections   ENDOVENOUS  ABLATION SAPHENOUS VEIN W/ LASER Right 02-28-2014   right greater saphenous vein and stab phlebectomy 10-20 incisions right leg  by Gretta Began MD   ENDOVENOUS ABLATION SAPHENOUS VEIN W/ LASER Left 07-18-2014   EVLA left greater saphenous vein, stab phlebectomy 10-20 incisions left leg , and sclerotherapy left leg     ENDOVENOUS ABLATION SAPHENOUS VEIN W/ LASER Left 08-01-2014   EVLA  left small saphenous vein  by Gretta Began MD   HERNIA REPAIR     INCISIONAL HERNIA REPAIR N/A 10/26/2013   Procedure: LAPAROSCOPIC INCISIONAL HERNIA;  Surgeon: Atilano Ina, MD;  Location: [facility];  Service: General;  Laterality: N/A;   INSERTION OF MESH N/A 10/26/2013   Procedure: INSERTION OF MESH;  Surgeon: Atilano Ina, MD;  Location: [facility];  Service: General;  Laterality: N/A;   KNEE ARTHROSCOPY  2002   left knee   LAPAROSCOPIC CHOLECYSTECTOMY W/ CHOLANGIOGRAPHY  07/30/11   NOSE SURGERY     TUBAL LIGATION  1976   vaginal sling      Current Medications: Outpatient Medications Prior to Visit  Medication Sig Dispense Refill   albuterol (VENTOLIN HFA) 108 (90 Base) MCG/ACT inhaler INHALE 1-2 PUFFS INTO THE LUNGS EVERY 6 (SIX) HOURS AS NEEDED FOR WHEEZING OR SHORTNESS OF BREATH. 8.5 each 11   atorvastatin (LIPITOR) 40 MG tablet TAKE 1 TABLET BY MOUTH EVERY DAY AT 6PM 90 tablet 0   Famotidine (PEPCID PO) Take by mouth.     furosemide (LASIX) 40 MG tablet TAKE 1 TABLET BY MOUTH 3  TIMES A WEEK ON MON/WED/FRI 39 tablet 3   olmesartan (BENICAR) 40 MG tablet TAKE 1 TABLET BY MOUTH EVERY DAY 90 tablet 1   No facility-administered medications prior to visit.     Allergies:   Erythromycin base, Lansoprazole, Prednisone, and Tetracyclines & related   Social History   Socioeconomic History   Marital status: Single    Spouse name: Not on file   Number of children: 2   Years of education: Not on file   Highest education level: Not on file  Occupational History   Occupation: Public affairs consultant: [employer]   Tobacco Use   Smoking status: Former    Packs/day: 1.00    Years: 28.00    Pack years: 28.00    Types: Cigarettes    Quit date: 09/20/2013    Years since quitting: 8.0   Smokeless tobacco: Never  Substance and Sexual Activity   Alcohol use: No   Drug use: No   Sexual activity: Not on file  Other Topics Concern   Not on file  Social History Narrative   Pt is single.   Social Determinants of Health   Financial Resource Strain: Not on file  Food Insecurity: Not on file  Transportation Needs: Not on file  Physical Activity: Not on file  Stress: Not on file  Social Connections: Not on file     Family History:  The patient's family history includes COPD in her father, mother, and sister; Cancer in her sister; Coronary artery disease in an other family member; Diabetes in her brother and mother; Heart disease in her brother, father, mother, and sister; Hypertension in her brother, father, mother, sister, and another family member; Other in her mother; Rheum arthritis in her mother.   ROS:   Please see the history of present illness.    ROS  All other systems are reviewed and are negative.   PHYSICAL EXAM:   VS:  BP 122/86   Pulse 82   Ht 5\' 6"  (1.676 m)   Wt 244 lb 3.2 oz (110.8 kg)   SpO2 99%   BMI 39.41 kg/m     General: Alert, oriented x3, no distress, severely obese Head: no evidence of trauma, PERRL, EOMI, no exophtalmos or lid lag, no myxedema, no xanthelasma; normal ears, nose and oropharynx Neck: normal jugular venous pulsations and no hepatojugular reflux; brisk carotid pulses without delay and no carotid bruits Chest: clear to auscultation, no signs of consolidation by percussion or palpation, normal fremitus, symmetrical and full respiratory excursions Cardiovascular: normal position and quality of the apical impulse, regular rhythm, normal first and second heart sounds, no murmurs, rubs or gallops Abdomen: no tenderness or distention, no masses by palpation, no  abnormal pulsatility or arterial bruits, normal bowel sounds, no hepatosplenomegaly Extremities: no clubbing, cyanosis or edema; 2+ radial, ulnar and brachial pulses bilaterally; 2+ right femoral, posterior tibial and dorsalis pedis pulses; 2+ left femoral, posterior tibial and dorsalis pedis pulses; no subclavian or femoral bruits Neurological: grossly nonfocal Psych: Normal mood and affect    Wt Readings from Last 3 Encounters:  10/09/21 244 lb 3.2 oz (110.8 kg)  12/16/20 243 lb (110.2 kg)  11/11/20 242 lb 12.8 oz (110.1 kg)      Studies/Labs Reviewed:   EKG:  EKG is ordered today.  Personally reviewed, it is unchanged from previous tracings, showing first-degree AV block (PR 210 ms), RBBB, no ischemic repolarization abnormalities.  QTc 448 ms Recent Labs:     Component  Value Date/Time   CHOL 131 09/25/2021 1019   TRIG 71 09/25/2021 1019   HDL 55 09/25/2021 1019   CHOLHDL 2.4 09/25/2021 1019   CHOLHDL 2.9 12/21/2017 1020   VLDL 11 12/28/2016 1047   LDLCALC 62 09/25/2021 1019   LDLCALC 79 12/21/2017 1020    ASSESSMENT:    1. Chronic right heart failure (HCC)   2. Essential hypertension   3. Coronary artery calcification   4. Aortic atherosclerosis (HCC)   5. Hypercholesterolemia   6. Chronic obstructive pulmonary disease, unspecified COPD type (HCC)   7. Elevated serum creatinine      PLAN:  In order of problems listed above:  CHF: Very rarely she takes a diuretic for lower extremity edema, usually after a salty dietary indiscretion.  I suspect that if she does have any heart failure, it is probably right heart failure related to COPD and obesity, but most of the swelling may simply be peripheral venous insufficiency.  There are very few if any convincing signs of true diastolic left ventricular dysfunction.   HTN: l well-controlled Coronary calcification: She has never had angina and had a low risk nuclear perfusion study in 2017.  The focus is on risk factor  modification. Aortic atherosclerosis: On imaging studies, but without symptoms of PAD or ischemic abnormalities on cardiac nuclear stress testing. HLP: Continue statin. LDL at target <70 COPD: Occasionally uses her inhaler. Elevated creatinine: In 2020 and 2021 her creatinine was around 0.8-0.9, last month was up to 1.27.  This may have been due to relative dehydration, since but the BUN was actually low at 15.  This should be reevaluated.     Medication Adjustments/Labs and Tests Ordered: Current medicines are reviewed at length with the patient today.  Concerns regarding medicines are outlined above.  Medication changes, Labs and Tests ordered today are listed in the Patient Instructions below. Patient Instructions  Medication Instructions:  No changes *If you need a refill on your cardiac medications before your next appointment, please call your pharmacy*   Lab Work: None ordered If you have labs (blood work) drawn today and your tests are completely normal, you will receive your results only by: MyChart Message (if you have MyChart) OR A paper copy in the mail If you have any lab test that is abnormal or we need to change your treatment, we will call you to review the results.   Testing/Procedures: None ordered   Follow-Up: At [facility], you and your health needs are our priority.  As part of our continuing mission to provide you with exceptional heart care, we have created designated Provider Care Teams.  These Care Teams include your primary Cardiologist (physician) and Advanced Practice Providers (APPs -  Physician Assistants and Nurse Practitioners) who all work together to provide you with the care you need, when you need it.  We recommend signing up for the patient portal called "MyChart".  Sign up information is provided on this After Visit Summary.  MyChart is used to connect with patients for Virtual Visits (Telemedicine).  Patients are able to view lab/test  results, encounter notes, upcoming appointments, etc.  Non-urgent messages can be sent to your provider as well.   To learn more about what you can do with MyChart, go to [email or web address].    Your next appointment:   12 month(s)  The format for your next appointment:   In Person  Provider:   Thurmon Fair, MD      Signed, Thurmon Fair, MD  10/10/2021 5:38 PM    [facility] [address] [place], [place], [place]  [address] Phone: [phone]; Fax: [phone]

## 2021-10-09 NOTE — Patient Instructions (Signed)

## 2021-10-10 ENCOUNTER — Encounter: Payer: Self-pay | Admitting: Cardiovascular Disease

## 2021-12-08 ENCOUNTER — Telehealth: Payer: Self-pay | Admitting: Cardiovascular Disease

## 2021-12-08 MED ORDER — ATORVASTATIN CALCIUM 40 MG PO TABS: ORAL_TABLET | ORAL | 3 refills | Status: DC

## 2021-12-08 NOTE — Telephone Encounter (Signed)
Refilled as requested  

## 2021-12-08 NOTE — Telephone Encounter (Signed)
°*  STAT* If patient is at the pharmacy, call can be transferred to refill team.   1. Which medications need to be refilled? (please list name of each medication and dose if known)  atorvastatin (LIPITOR) 40 MG tablet  2. Which pharmacy/location (including street and city if local pharmacy) is medication to be sent to? [id number] - [place], [place] - [address]  3. Do they need a 30 day or 90 day supply? 90 day supply

## 2022-02-01 ENCOUNTER — Other Ambulatory Visit: Payer: Self-pay | Admitting: Cardiovascular Disease

## 2022-02-15 ENCOUNTER — Other Ambulatory Visit: Payer: Self-pay | Admitting: Cardiovascular Disease

## 2022-03-22 ENCOUNTER — Other Ambulatory Visit: Payer: Self-pay | Admitting: Cardiovascular Disease

## 2022-04-09 ENCOUNTER — Other Ambulatory Visit: Payer: Self-pay | Admitting: Cardiovascular Disease

## 2022-07-01 ENCOUNTER — Other Ambulatory Visit: Payer: Self-pay | Admitting: Cardiovascular Disease

## 2022-08-23 ENCOUNTER — Other Ambulatory Visit: Payer: Self-pay | Admitting: Cardiovascular Disease

## 2022-09-28 ENCOUNTER — Other Ambulatory Visit: Payer: Self-pay | Admitting: Cardiovascular Disease

## 2022-09-28 NOTE — Telephone Encounter (Signed)
Rx refill sent to pharmacy. 

## 2022-10-14 ENCOUNTER — Ambulatory Visit: Payer: PPO | Attending: Cardiovascular Disease | Admitting: Cardiovascular Disease

## 2022-10-14 ENCOUNTER — Encounter: Payer: Self-pay | Admitting: Cardiovascular Disease

## 2022-10-14 VITALS — BP 106/76 | HR 85 | Ht 66.0 in | Wt 229.4 lb

## 2022-10-14 DIAGNOSIS — I2510 Atherosclerotic heart disease of native coronary artery without angina pectoris: Secondary | ICD-10-CM | POA: Diagnosis not present

## 2022-10-14 DIAGNOSIS — N1831 Chronic kidney disease, stage 3a: Secondary | ICD-10-CM

## 2022-10-14 DIAGNOSIS — I700 Atherosclerosis of aorta: Secondary | ICD-10-CM

## 2022-10-14 DIAGNOSIS — I50812 Chronic right heart failure: Secondary | ICD-10-CM

## 2022-10-14 DIAGNOSIS — E7800 Pure hypercholesterolemia, unspecified: Secondary | ICD-10-CM

## 2022-10-14 DIAGNOSIS — Z79899 Other long term (current) drug therapy: Secondary | ICD-10-CM

## 2022-10-14 DIAGNOSIS — I2584 Coronary atherosclerosis due to calcified coronary lesion: Secondary | ICD-10-CM

## 2022-10-14 DIAGNOSIS — D696 Thrombocytopenia, unspecified: Secondary | ICD-10-CM

## 2022-10-14 DIAGNOSIS — J449 Chronic obstructive pulmonary disease, unspecified: Secondary | ICD-10-CM

## 2022-10-14 DIAGNOSIS — I10 Essential (primary) hypertension: Secondary | ICD-10-CM

## 2022-10-14 MED ORDER — OLMESARTAN MEDOXOMIL 20 MG PO TABS: 20.0000 mg | ORAL_TABLET | Freq: Every day | ORAL | 3 refills | Status: DC

## 2022-10-14 NOTE — Patient Instructions (Signed)
Medication Instructions:  Decrease Olmesartan 20 mg daily   *If you need a refill on your cardiac medications before your next appointment, please call your pharmacy*   Lab Work: LIPID, CBC, B12, CMET today   If you have labs (blood work) drawn today and your tests are completely normal, you will receive your results only by: MyChart Message (if you have MyChart) OR A paper copy in the mail If you have any lab test that is abnormal or we need to change your treatment, we will call you to review the results.   Follow-Up: At [facility], you and your health needs are our priority.  As part of our continuing mission to provide you with exceptional heart care, we have created designated Provider Care Teams.  These Care Teams include your primary Cardiologist (physician) and Advanced Practice Providers (APPs -  Physician Assistants and Nurse Practitioners) who all work together to provide you with the care you need, when you need it.  We recommend signing up for the patient portal called "MyChart".  Sign up information is provided on this After Visit Summary.  MyChart is used to connect with patients for Virtual Visits (Telemedicine).  Patients are able to view lab/test results, encounter notes, upcoming appointments, etc.  Non-urgent messages can be sent to your provider as well.   To learn more about what you can do with MyChart, go to [email or web address].    Your next appointment:   12 month(s)  The format for your next appointment:   In Person  Provider:   Thurmon Fair, MD

## 2022-10-14 NOTE — Progress Notes (Signed)
Cardiology Office Note    Date:  10/17/2022   ID:  Meghan Griffith, DOB [birth year], MRN [id number]  PCP:  Roger Kill, PA-C  Cardiologist:   Thurmon Fair, MD  Referred by: Rueben Griffith, [facility]  Chief Complaint  Patient presents with   Congestive Heart Failure    History of Present Illness:  Meghan Griffith is a 76 y.o. female with lower extremity edema, extensive coronary calcifications on chest CT but with normal left ventricular systolic function, normal myocardial perfusion pattern and no history of angina.   She is doing remarkably well.  Her edema is very well controlled and she only needs furosemide less than once a month.  She has lost weight, although she remains in severely obese range.  She is not using an inhaler.  She does not have orthopnea or PND and has never complained of chest discomfort.  She does complain of sweating at night.  This often happens when she gets out of bed to use the restroom, then when she returns to that she breaks out in a sweat.  She has not had syncope or palpitations.  She denies any chest pain or shortness of breath at rest or with her usual light activity.  She has had some problems with epistaxis.  She has not had falls or injuries or severe bleeding.  She has been taking B12 shots, but stopped these because she believed they caused worsening headaches.  Her blood pressure is always well controlled and today appears to be on the low end of normal.  She is compliant with her statin and her most recent LDL cholesterol was 62.  She does not have diabetes mellitus.  Most recent creatinine was 1.27, a year ago.  Her echo was interpreted as showing grade 1 diastolic dysfunction, but I think it was normal. Her lateral and medial diastolic annular velocities were 12 and 10 cm/second respectively, normal for her age.  Past Medical History:  Diagnosis Date   Arthritis    Cataracts, bilateral    Cholelithiasis    Chronic  headache    Incontinence    Lower leg edema    Phlebitis    PVC (premature ventricular contraction)    SOB (shortness of breath)    Varicose veins    Venous insufficiency (chronic) (peripheral)     Past Surgical History:  Procedure Laterality Date   CHOLECYSTECTOMY     collagen     injections   ENDOVENOUS ABLATION SAPHENOUS VEIN W/ LASER Right 02-28-2014   right greater saphenous vein and stab phlebectomy 10-20 incisions right leg  by Gretta Began MD   ENDOVENOUS ABLATION SAPHENOUS VEIN W/ LASER Left 07-18-2014   EVLA left greater saphenous vein, stab phlebectomy 10-20 incisions left leg , and sclerotherapy left leg     ENDOVENOUS ABLATION SAPHENOUS VEIN W/ LASER Left 08-01-2014   EVLA  left small saphenous vein  by Gretta Began MD   HERNIA REPAIR     INCISIONAL HERNIA REPAIR N/A 10/26/2013   Procedure: LAPAROSCOPIC INCISIONAL HERNIA;  Surgeon: Atilano Ina, MD;  Location: [facility];  Service: General;  Laterality: N/A;   INSERTION OF MESH N/A 10/26/2013   Procedure: INSERTION OF MESH;  Surgeon: Atilano Ina, MD;  Location: [facility];  Service: General;  Laterality: N/A;   KNEE ARTHROSCOPY  2002   left knee   LAPAROSCOPIC CHOLECYSTECTOMY W/ CHOLANGIOGRAPHY  07/30/11   NOSE SURGERY     TUBAL LIGATION  1976   vaginal  sling      Current Medications: Outpatient Medications Prior to Visit  Medication Sig Dispense Refill   albuterol (VENTOLIN HFA) 108 (90 Base) MCG/ACT inhaler Inhale 1-2 puffs into the lungs every 6 (six) hours as needed for wheezing or shortness of breath. 8.5 each 0   atorvastatin (LIPITOR) 40 MG tablet Take by mouth daily at 6 pm 90 tablet 3   Famotidine (PEPCID PO) Take by mouth.     furosemide (LASIX) 40 MG tablet TAKE 1 TABLET BY MOUTH 3 TIMES A WEEK ON MON/WED/FRI 39 tablet 3   olmesartan (BENICAR) 40 MG tablet TAKE 1 TABLET BY MOUTH EVERY DAY 90 tablet 0   No facility-administered medications prior to visit.     Allergies:   Erythromycin base, Lansoprazole,  Prednisone, and Tetracyclines & related   Social History   Socioeconomic History   Marital status: Single    Spouse name: Not on file   Number of children: 2   Years of education: Not on file   Highest education level: Not on file  Occupational History   Occupation: Public affairs consultant: [employer]  Tobacco Use   Smoking status: Former    Packs/day: 1.00    Years: 28.00    Total pack years: 28.00    Types: Cigarettes    Quit date: 09/20/2013    Years since quitting: 9.0   Smokeless tobacco: Never  Substance and Sexual Activity   Alcohol use: No   Drug use: No   Sexual activity: Not on file  Other Topics Concern   Not on file  Social History Narrative   Pt is single.   Social Determinants of Health   Financial Resource Strain: Not on file  Food Insecurity: Not on file  Transportation Needs: Not on file  Physical Activity: Not on file  Stress: Not on file  Social Connections: Not on file     Family History:  The patient's family history includes COPD in her father, mother, and sister; Cancer in her sister; Coronary artery disease in an other family member; Diabetes in her brother and mother; Heart disease in her brother, father, mother, and sister; Hypertension in her brother, father, mother, sister, and another family member; Other in her mother; Rheum arthritis in her mother.   ROS:   Please see the history of present illness.    ROS  All other systems are reviewed and are negative.   PHYSICAL EXAM:   VS:  BP 106/76 (BP Location: Left Arm, Patient Position: Sitting, Cuff Size: Large)   Pulse 85   Ht 5\' 6"  (1.676 m)   Wt 104.1 kg   SpO2 95%   BMI 37.03 kg/m      General: Alert, oriented x3, no distress, severely obese Head: no evidence of trauma, PERRL, EOMI, no exophtalmos or lid lag, no myxedema, no xanthelasma; normal ears, nose and oropharynx Neck: normal jugular venous pulsations and no hepatojugular reflux; brisk carotid pulses without delay and no  carotid bruits Chest: clear to auscultation, no signs of consolidation by percussion or palpation, normal fremitus, symmetrical and full respiratory excursions Cardiovascular: normal position and quality of the apical impulse, regular rhythm, normal first and second heart sounds, no murmurs, rubs or gallops Abdomen: no tenderness or distention, no masses by palpation, no abnormal pulsatility or arterial bruits, normal bowel sounds, no hepatosplenomegaly Extremities: no clubbing, cyanosis or edema; 2+ radial, ulnar and brachial pulses bilaterally; 2+ right femoral, posterior tibial and dorsalis pedis pulses; 2+ left femoral,  posterior tibial and dorsalis pedis pulses; no subclavian or femoral bruits Neurological: grossly nonfocal Psych: Normal mood and affect     Wt Readings from Last 3 Encounters:  10/14/22 104.1 kg  10/09/21 110.8 kg  12/16/20 110.2 kg      Studies/Labs Reviewed:   EKG:  EKG is ordered today.  Is similar to previous tracings and shows sinus rhythm with first-degree AV block and incomplete right bundle branch block.  Two PVCs are seen.  QTc 459 ms. Recent Labs:     Component Value Date/Time   CHOL 135 10/14/2022 1612   TRIG 66 10/14/2022 1612   HDL 49 10/14/2022 1612   CHOLHDL 2.8 10/14/2022 1612   CHOLHDL 2.9 12/21/2017 1020   VLDL 11 12/28/2016 1047   LDLCALC 72 10/14/2022 1612   LDLCALC 79 12/21/2017 1020    ASSESSMENT:    1. Chronic right heart failure (HCC)   2. Essential hypertension   3. Medication management   4. Coronary artery calcification   5. Aortic atherosclerosis (HCC)   6. Hypercholesterolemia   7. Chronic obstructive pulmonary disease, unspecified COPD type (HCC)   8. Chronic renal impairment, stage 3a (HCC)   9. Thrombocytopenia (HCC)      PLAN:  In order of problems listed above:  CHF: With weight loss and careful attention to diet low in sodium she is able to prevent any problems with edema.  Taking diuretics less than once a  month.  I do not think she has left heart failure either systolic or diastolic.  May have some degree of right heart failure due to COPD and obesity. HTN: Very well controlled.  I wonder if her episodes of diaphoresis may actually be an expression of orthostatic hypotension.  Asked her to decrease the dose of olmesartan in half to only 20 mg once daily. Coronary calcification: She has never had angina and had a low risk nuclear perfusion study in 2017.  The focus remains on controlling her risk factors. Aortic atherosclerosis: This was detected incidentally on imaging studies.  No clinical evidence of CAD or PAD, normal caliber aorta. HLP: Continues to in for target LDL less than 70. COPD: Rarely uses her inhaler. CKD 3A: Recheck today, unchanged and consistent with CKD 3A, GFR 45. Thrombocytopenia: Mild, stable.  Vitamin B12 level is normal.  Etiology uncertain.  Has not had serious bleeding, but has had some epistaxis.  Consider stopping famotidine.  Consider hematology referral.     Medication Adjustments/Labs and Tests Ordered: Current medicines are reviewed at length with the patient today.  Concerns regarding medicines are outlined above.  Medication changes, Labs and Tests ordered today are listed in the Patient Instructions below. Patient Instructions  Medication Instructions:  Decrease Olmesartan 20 mg daily   *If you need a refill on your cardiac medications before your next appointment, please call your pharmacy*   Lab Work: LIPID, CBC, B12, CMET today   If you have labs (blood work) drawn today and your tests are completely normal, you will receive your results only by: MyChart Message (if you have MyChart) OR A paper copy in the mail If you have any lab test that is abnormal or we need to change your treatment, we will call you to review the results.   Follow-Up: At [facility], you and your health needs are our priority.  As part of our continuing mission to  provide you with exceptional heart care, we have created designated Provider Care Teams.  These Care Teams  include your primary Cardiologist (physician) and Advanced Practice Providers (APPs -  Physician Assistants and Nurse Practitioners) who all work together to provide you with the care you need, when you need it.  We recommend signing up for the patient portal called "MyChart".  Sign up information is provided on this After Visit Summary.  MyChart is used to connect with patients for Virtual Visits (Telemedicine).  Patients are able to view lab/test results, encounter notes, upcoming appointments, etc.  Non-urgent messages can be sent to your provider as well.   To learn more about what you can do with MyChart, go to [email or web address].    Your next appointment:   12 month(s)  The format for your next appointment:   In Person  Provider:   Thurmon Fair, MD           Signed, Thurmon Fair, MD  10/17/2022 4:49 PM    [facility] [address] [place], [place], [place]  [address] Phone: [phone]; Fax: [phone]

## 2022-10-15 LAB — COMPREHENSIVE METABOLIC PANEL
ALT: 12 IU/L (ref 0–32)
AST: 20 IU/L (ref 0–40)
Albumin/Globulin Ratio: 1.9 (ref 1.2–2.2)
Albumin: 5 g/dL — ABNORMAL HIGH (ref 3.8–4.8)
Alkaline Phosphatase: 155 IU/L — ABNORMAL HIGH (ref 44–121)
BUN/Creatinine Ratio: 20 (ref 12–28)
BUN: 26 mg/dL (ref 8–27)
Bilirubin Total: 0.7 mg/dL (ref 0.0–1.2)
CO2: 20 mmol/L (ref 20–29)
Calcium: 10.2 mg/dL (ref 8.7–10.3)
Chloride: 102 mmol/L (ref 96–106)
Creatinine, Ser: 1.28 mg/dL — ABNORMAL HIGH (ref 0.57–1.00)
Globulin, Total: 2.6 g/dL (ref 1.5–4.5)
Glucose: 85 mg/dL (ref 70–99)
Potassium: 4.3 mmol/L (ref 3.5–5.2)
Sodium: 140 mmol/L (ref 134–144)
Total Protein: 7.6 g/dL (ref 6.0–8.5)
eGFR: 44 mL/min/{1.73_m2} — ABNORMAL LOW (ref >59)

## 2022-10-15 LAB — CBC
Hematocrit: 42.4 % (ref 34.0–46.6)
Hemoglobin: 13.9 g/dL (ref 11.1–15.9)
MCH: 28.9 pg (ref 26.6–33.0)
MCHC: 32.8 g/dL (ref 31.5–35.7)
MCV: 88 fL (ref 79–97)
Platelets: 109 10*3/uL — ABNORMAL LOW (ref 150–450)
RBC: 4.81 x10E6/uL (ref 3.77–5.28)
RDW: 12.4 % (ref 11.7–15.4)
WBC: 6.1 10*3/uL (ref 3.4–10.8)

## 2022-10-15 LAB — LIPID PANEL
Chol/HDL Ratio: 2.8 ratio (ref 0.0–4.4)
Cholesterol, Total: 135 mg/dL (ref 100–199)
HDL: 49 mg/dL (ref >39)
LDL Chol Calc (NIH): 72 mg/dL (ref 0–99)
Triglycerides: 66 mg/dL (ref 0–149)
VLDL Cholesterol Cal: 14 mg/dL (ref 5–40)

## 2022-10-15 LAB — VITAMIN B12: Vitamin B-12: 592 pg/mL (ref 232–1245)

## 2022-10-17 ENCOUNTER — Encounter: Payer: Self-pay | Admitting: Cardiovascular Disease

## 2022-10-26 ENCOUNTER — Other Ambulatory Visit: Payer: Self-pay | Admitting: Cardiovascular Disease

## 2022-11-06 ENCOUNTER — Other Ambulatory Visit: Payer: Self-pay | Admitting: Cardiovascular Disease

## 2022-11-19 ENCOUNTER — Other Ambulatory Visit: Payer: Self-pay | Admitting: Cardiovascular Disease

## 2022-12-22 ENCOUNTER — Other Ambulatory Visit: Payer: Self-pay | Admitting: Cardiovascular Disease

## 2023-03-22 ENCOUNTER — Other Ambulatory Visit: Payer: Self-pay | Admitting: Cardiovascular Disease

## 2023-04-10 ENCOUNTER — Other Ambulatory Visit: Payer: Self-pay | Admitting: Cardiovascular Disease

## 2023-07-26 ENCOUNTER — Other Ambulatory Visit: Payer: Self-pay | Admitting: Cardiovascular Disease

## 2023-08-15 ENCOUNTER — Other Ambulatory Visit: Payer: Self-pay | Admitting: Cardiovascular Disease

## 2023-10-10 NOTE — Progress Notes (Signed)
Cardiology Office Note:    Date:  10/20/2023   ID:  Meghan Griffith, DOB 12-21-1945, MRN [id number]  PCP:  Roger Kill, PA-C  Cardiologist:  Thurmon Fair, MD     Referring MD: Roger Kill, *   Chief Complaint: routine follow-up of right sided CHF  History of Present Illness:    Meghan Griffith is a 77 y.o. female with a history of coronary artery calcifications noted on prior chest CT, chronic right CHF with lower extremity edema, hypertension, hyperlipidemia, CKD stage IIIa, COPD, thrombocytopenia, and morbid obesity who is followed by Dr. Royann Shivers and presents today for routine follow-up.   Patient is primarily followed by Dr. Royann Shivers for right sided CHF. She has a history of lower extremity edema with this which has been well controlled with PRN Lasix. She also has a history of extensive coronary artery calcifications noted on a prior chest CT but has never had any angina. Myoview in 09/2016 was low risk with no evidence of ischemia. Last Echo in 09/2016 showed LVEF of 50-55% with normal wall motion and grade 1 diastolic dysfunction.   She was last seen by Dr. Royann Shivers in 10/2022 at which time she was doing remarkably well. Her lower extremity edema remained well controlled and she was only requiring her PRN Lasix about once a month.   Patient presents today for follow-up. EKG today shows mild sinus tachycardia with a new incomplete LBBB and new T waves inversions in inferior leads. However, she states she is very well. She denies any cardiac symptoms. No chest pain, shortness of breath, orthopnea, PND, palpitations, lightheadedness, dizziness, or syncope. She has occasional lower extremity edema especially if she gets something salty but normally edema is well controlled. She has PRN Lasix for her edema which works well.   EKGs/Labs/Other Studies Reviewed:    The following studies were reviewed:  Myoview 09/24/2016: The left ventricular ejection  fraction is normal (55-65%). Nuclear stress EF: 60%. No T wave inversion was noted during stress. There was no ST segment deviation noted during stress. This is a low risk study.   Normal perfusion. LVEF 60% with normal wall motion. This is a low risk study. _______________  Echocardiogram 09/28/2016: Study Conclusions: - Left ventricle: The cavity size was normal. Wall thickness was    normal. Systolic function was normal. The estimated ejection    fraction was in the range of 50% to 55%. Wall motion was normal;    there were no regional wall motion abnormalities. Doppler    parameters are consistent with abnormal left ventricular    relaxation (grade 1 diastolic dysfunction).  - Aortic valve: Trileaflet; mildly thickened, mildly calcified    leaflets.  - Mitral valve: There was trivial regurgitation.    EKG:  EKG ordered today.   EKG Interpretation Date/Time:  Thursday October 20 2023 15:00:11 EST Ventricular Rate:  101 PR Interval:  234 QRS Duration:  116 QT Interval:  362 QTC Calculation: 469 R Axis:   121  Text Interpretation: Sinus tachycardia with 1st degree A-V block with Premature atrial complexes Incomplete left bundle branch block T wave inversions in inferior leads Abnormal T waves in leads V5-V6 Confirmed by Marjie Skiff [phone]) on 10/20/2023 3:53:15 PM    Recent Labs: No results found for requested labs within last 365 days.  Recent Lipid Panel    Component Value Date/Time   CHOL 135 10/14/2022 1612   TRIG 66 10/14/2022 1612   HDL 49 10/14/2022 1612  CHOLHDL 2.8 10/14/2022 1612   CHOLHDL 2.9 12/21/2017 1020   VLDL 11 12/28/2016 1047   LDLCALC 72 10/14/2022 1612   LDLCALC 79 12/21/2017 1020    Physical Exam:    Vital Signs: BP 122/78   Pulse (!) 102   Ht 5\' 6"  (1.676 m)   Wt 234 lb 9.6 oz (106.4 kg)   SpO2 99%   BMI 37.87 kg/m     Wt Readings from Last 3 Encounters:  10/20/23 234 lb 9.6 oz (106.4 kg)  10/14/22 229 lb 6.4 oz (104.1 kg)   10/09/21 244 lb 3.2 oz (110.8 kg)     General: 77 y.o. obese Caucasian female in no acute distress. HEENT: Normocephalic and atraumatic. Sclera clear.  Neck: Supple. No JVD. Heart: Borderline tachycardic with normal rhythm. . No murmurs, gallops, or rubs.  Lungs: No increased work of breathing. Clear to ausculation bilaterally. No wheezes, rhonchi, or rales.  Abdomen: Soft, non-distended, and non-tender to palpation.  Extremities: Minimal nonpitting edema of bilateral lower extremities (mostly around ankles). Skin: Warm and dry. Neuro: No focal deficits. Psych: Normal affect. Responds appropriately.  Assessment:    1. Abnormal EKG   2. Coronary artery calcification   3. Chronic right heart failure (HCC)   4. Hypertension, unspecified type   5. Hyperlipidemia, unspecified hyperlipidemia type   6. Stage 3 chronic kidney disease, unspecified whether stage 3a or 3b CKD (HCC)     Plan:    Abnormal EKG Coronary Artery Calcifications Prior chest CT has showed extensive coronary artery calcifications; however, she has never had any angina. Myoview in 09/2016 was low risk with no evidence of ischemia. EKG today shows borderline sinus tachycardia, rate 101 bpm, with new incomplete LBBB and new T wave inversions in inferior leads and abnormal T waves in V5-V6.  - No chest pain or anginal symptoms.  - Continue statin. - Reviewed EKG with Dr. Antoine Poche who agreed it is sinus tachycardia rather than atrial flutter. He recommended starting with an Echo for further evaluation of EKG changes. If EF is down, will likely need LHC.  Will also check BMET, Mag, and TSH given tachycardia. Of note, heart rate did come down to the 80s to 90s during visit.   Chronic Right CHF Last Echo in 09/2016 showed LVEF of 50-55% with normal wall motion and grade 1 diastolic dysfunction. Per Dr. Erin Hearing last note, she is not felt to have left sided CHF (neither systolic or diastolic) and may have some degree of  right sided CHF due to COPD and obesity.  - Euvolemic on exam.  - Continue Lasix 40mg  as needed for edema.  - Recommended monitoring weights and sodium restrictions.   Hypertension BP well controlled.  - Continue Olmesartan 20mg  daily.  Hyperlipidemia Lipid panel in 10/2022: Total Cholesterol 135, Triglycerides 66, HDL 49, LDL 72.  - Continue Lipitor 40mg  daily.  - Will repeat lipid panel and CMET.   CKD Stage III Baseline creatinine around 1.2   Disposition: Follow up in 1 month after Echo.    Signed, Corrin Parker, PA-C  10/20/2023 4:10 PM    [facility]

## 2023-10-20 ENCOUNTER — Encounter: Payer: Self-pay | Admitting: Student

## 2023-10-20 ENCOUNTER — Other Ambulatory Visit: Payer: Self-pay | Admitting: Cardiovascular Disease

## 2023-10-20 ENCOUNTER — Ambulatory Visit: Payer: PPO | Attending: Student | Admitting: Student

## 2023-10-20 VITALS — BP 122/78 | HR 102 | Ht 66.0 in | Wt 234.6 lb

## 2023-10-20 DIAGNOSIS — I2510 Atherosclerotic heart disease of native coronary artery without angina pectoris: Secondary | ICD-10-CM | POA: Diagnosis not present

## 2023-10-20 DIAGNOSIS — I10 Essential (primary) hypertension: Secondary | ICD-10-CM

## 2023-10-20 DIAGNOSIS — I50812 Chronic right heart failure: Secondary | ICD-10-CM | POA: Diagnosis not present

## 2023-10-20 DIAGNOSIS — E785 Hyperlipidemia, unspecified: Secondary | ICD-10-CM

## 2023-10-20 DIAGNOSIS — R9431 Abnormal electrocardiogram [ECG] [EKG]: Secondary | ICD-10-CM | POA: Diagnosis not present

## 2023-10-20 DIAGNOSIS — N183 Chronic kidney disease, stage 3 unspecified: Secondary | ICD-10-CM

## 2023-10-20 MED ORDER — OLMESARTAN MEDOXOMIL 20 MG PO TABS: 20.0000 mg | ORAL_TABLET | Freq: Every day | ORAL | 3 refills | Status: DC

## 2023-10-20 MED ORDER — ATORVASTATIN CALCIUM 40 MG PO TABS: ORAL_TABLET | ORAL | 3 refills | Status: DC

## 2023-10-20 MED ORDER — FUROSEMIDE 40 MG PO TABS: 40.0000 mg | ORAL_TABLET | Freq: Every day | ORAL | 11 refills | Status: DC | PRN

## 2023-10-20 NOTE — Patient Instructions (Addendum)
Medication Instructions:  Your physician recommends that you continue on your current medications as directed. Please refer to the Current Medication list given to you today.  *If you need a refill on your cardiac medications before your next appointment, please call your pharmacy*   Lab Work: Lipid panel, CMET, TSH, Magnesium today    Testing/Procedures: Your physician has requested that you have an echocardiogram. Echocardiography is a painless test that uses sound waves to create images of your heart. It provides your doctor with information about the size and shape of your heart and how well your heart's chambers and valves are working. This procedure takes approximately one hour. There are no restrictions for this procedure. Please do NOT wear cologne, perfume, aftershave, or lotions (deodorant is allowed). Please arrive 15 minutes prior to your appointment time.  Please note: We ask at that you not bring children with you during ultrasound (echo/ vascular) testing. Due to room size and safety concerns, children are not allowed in the ultrasound rooms during exams. Our front office staff cannot provide observation of children in our lobby area while testing is being conducted. An adult accompanying a patient to their appointment will only be allowed in the ultrasound room at the discretion of the ultrasound technician under special circumstances. We apologize for any inconvenience.    Follow-Up: At [facility], you and your health needs are our priority.  As part of our continuing mission to provide you with exceptional heart care, we have created designated Provider Care Teams.  These Care Teams include your primary Cardiologist (physician) and Advanced Practice Providers (APPs -  Physician Assistants and Nurse Practitioners) who all work together to provide you with the care you need, when you need it.  We recommend signing up for the patient portal called "MyChart".  Sign up  information is provided on this After Visit Summary.  MyChart is used to connect with patients for Virtual Visits (Telemedicine).  Patients are able to view lab/test results, encounter notes, upcoming appointments, etc.  Non-urgent messages can be sent to your provider as well.   To learn more about what you can do with MyChart, go to [email or web address].    Your next appointment:   2-4 weeks Follow up post echo   Provider:   Thurmon Fair, MD  or Marjie Skiff, PA-C

## 2023-10-21 LAB — COMPREHENSIVE METABOLIC PANEL
ALT: 16 [IU]/L (ref 0–32)
AST: 25 [IU]/L (ref 0–40)
Albumin: 4.6 g/dL (ref 3.8–4.8)
Alkaline Phosphatase: 138 [IU]/L — ABNORMAL HIGH (ref 44–121)
BUN/Creatinine Ratio: 22 (ref 12–28)
BUN: 32 mg/dL — ABNORMAL HIGH (ref 8–27)
Bilirubin Total: 0.7 mg/dL (ref 0.0–1.2)
CO2: 19 mmol/L — ABNORMAL LOW (ref 20–29)
Calcium: 9.9 mg/dL (ref 8.7–10.3)
Chloride: 101 mmol/L (ref 96–106)
Creatinine, Ser: 1.46 mg/dL — ABNORMAL HIGH (ref 0.57–1.00)
Globulin, Total: 2.9 g/dL (ref 1.5–4.5)
Glucose: 88 mg/dL (ref 70–99)
Potassium: 4.9 mmol/L (ref 3.5–5.2)
Sodium: 138 mmol/L (ref 134–144)
Total Protein: 7.5 g/dL (ref 6.0–8.5)
eGFR: 38 mL/min/{1.73_m2} — ABNORMAL LOW (ref >59)

## 2023-10-21 LAB — MAGNESIUM: Magnesium: 2.2 mg/dL (ref 1.6–2.3)

## 2023-10-21 LAB — LIPID PANEL
Chol/HDL Ratio: 2.8 ratio (ref 0.0–4.4)
Cholesterol, Total: 158 mg/dL (ref 100–199)
HDL: 57 mg/dL (ref >39)
LDL Chol Calc (NIH): 87 mg/dL (ref 0–99)
Triglycerides: 70 mg/dL (ref 0–149)
VLDL Cholesterol Cal: 14 mg/dL (ref 5–40)

## 2023-10-21 LAB — TSH: TSH: 3 u[IU]/mL (ref 0.450–4.500)

## 2023-10-21 NOTE — Telephone Encounter (Signed)
Ok to fill.  Please fill

## 2023-10-27 ENCOUNTER — Other Ambulatory Visit: Payer: Self-pay

## 2023-10-27 DIAGNOSIS — E7800 Pure hypercholesterolemia, unspecified: Secondary | ICD-10-CM

## 2023-10-27 DIAGNOSIS — Z79899 Other long term (current) drug therapy: Secondary | ICD-10-CM

## 2023-10-27 DIAGNOSIS — I2510 Atherosclerotic heart disease of native coronary artery without angina pectoris: Secondary | ICD-10-CM

## 2023-10-27 DIAGNOSIS — I700 Atherosclerosis of aorta: Secondary | ICD-10-CM

## 2023-10-27 MED ORDER — FUROSEMIDE 80 MG PO TABS: 80.0000 mg | ORAL_TABLET | Freq: Every day | ORAL | 6 refills | Status: DC | PRN

## 2023-10-28 ENCOUNTER — Telehealth: Payer: Self-pay | Admitting: Cardiovascular Disease

## 2023-10-28 MED ORDER — ATORVASTATIN CALCIUM 80 MG PO TABS: ORAL_TABLET | ORAL | 3 refills | Status: DC

## 2023-10-28 MED ORDER — FUROSEMIDE 40 MG PO TABS: 40.0000 mg | ORAL_TABLET | Freq: Every day | ORAL | 6 refills | Status: DC | PRN

## 2023-10-28 NOTE — Telephone Encounter (Signed)
Patient identification verified by 2 forms. Marilynn Rail, RN    Called and spoke to patient  Relayed provider message below  Informed patient new Rx sent for Lasix 40mg  PRN for Edema/fluid and Lipitor 80mg   Patient verbalized understanding, no questions at this time

## 2023-10-28 NOTE — Telephone Encounter (Signed)
Patient identification verified by 2 forms. Marilynn Rail, RN    Called and spoke to patient  Patient states:   -Furosemide Rx was increased   -would like to know why it was increased  Informed patient message sent to visit provider for clarification  Patient verbalized understanding, no questions at this time   Per 11/14 visit:

## 2023-10-28 NOTE — Telephone Encounter (Signed)
Pt c/o medication issue:  1. Name of Medication: Furosemide 80 mg  2. How are you currently taking this medication (dosage and times per day)?   3. Are you having a reaction (difficulty breathing--STAT)?   4. What is your medication issue? Patient wants to know if this is the correct dose that she is supposed to be taking?

## 2023-10-28 NOTE — Telephone Encounter (Signed)
I think it was a mistake from when she was called with the lab results from 10/20/2023. I received a request to co-sign an order for Lasix 80mg  as needed but declined that. She is supposed to be on Lasix 40mg  as needed for edema. Her Lipitor was what was supposed to be increased to 80mg  daily.  Thank you!

## 2023-10-31 ENCOUNTER — Telehealth: Payer: Self-pay

## 2023-10-31 NOTE — Telephone Encounter (Signed)
Patient states she is returning a call to discuss medication instructions.

## 2023-10-31 NOTE — Telephone Encounter (Signed)
-----   Message from Corrin Parker sent at 10/27/2023 10:01 PM EST ----- Burley Saver,  I just got a request to co-sign an order for this patient for Lasix 80mg  as needed. However, it was her Lipitor that we were increasing to 80mg  daily. Her Lasix can stay at 40mg  as needed for edema.   Thank you! Callie

## 2023-10-31 NOTE — Telephone Encounter (Signed)
Patient identification verified by 2 forms. Marilynn Rail, RN    Called and spoke to patient  Patient states:   -she is returning call she received this morning to review Rx instruction  RN informed patient, instructions/medication adjustment completed on 11/22 during calls  Patient has no questions at this time

## 2023-11-28 ENCOUNTER — Encounter: Payer: Self-pay | Admitting: Hematology

## 2023-11-28 ENCOUNTER — Ambulatory Visit (HOSPITAL_COMMUNITY): Payer: PPO | Attending: Student

## 2023-11-28 DIAGNOSIS — R9431 Abnormal electrocardiogram [ECG] [EKG]: Secondary | ICD-10-CM

## 2023-11-28 LAB — ECHOCARDIOGRAM COMPLETE
Area-P 1/2: 5.65 cm2
S' Lateral: 2.1 cm

## 2023-12-14 ENCOUNTER — Ambulatory Visit: Payer: PPO | Admitting: Student

## 2023-12-17 ENCOUNTER — Other Ambulatory Visit: Payer: Self-pay | Admitting: Cardiovascular Disease

## 2024-02-14 ENCOUNTER — Encounter: Payer: Self-pay | Admitting: Cardiovascular Disease

## 2024-02-14 ENCOUNTER — Ambulatory Visit: Payer: PPO | Attending: Cardiovascular Disease | Admitting: Cardiovascular Disease

## 2024-02-14 VITALS — BP 112/90 | HR 109 | Ht 66.0 in | Wt 231.4 lb

## 2024-02-14 DIAGNOSIS — I700 Atherosclerosis of aorta: Secondary | ICD-10-CM | POA: Diagnosis not present

## 2024-02-14 DIAGNOSIS — D696 Thrombocytopenia, unspecified: Secondary | ICD-10-CM

## 2024-02-14 DIAGNOSIS — R899 Unspecified abnormal finding in specimens from other organs, systems and tissues: Secondary | ICD-10-CM

## 2024-02-14 DIAGNOSIS — I50812 Chronic right heart failure: Secondary | ICD-10-CM | POA: Diagnosis not present

## 2024-02-14 DIAGNOSIS — E7800 Pure hypercholesterolemia, unspecified: Secondary | ICD-10-CM

## 2024-02-14 DIAGNOSIS — N1832 Chronic kidney disease, stage 3b: Secondary | ICD-10-CM

## 2024-02-14 DIAGNOSIS — Z79899 Other long term (current) drug therapy: Secondary | ICD-10-CM

## 2024-02-14 DIAGNOSIS — R000 Tachycardia, unspecified: Secondary | ICD-10-CM | POA: Diagnosis not present

## 2024-02-14 DIAGNOSIS — J449 Chronic obstructive pulmonary disease, unspecified: Secondary | ICD-10-CM

## 2024-02-14 DIAGNOSIS — I10 Essential (primary) hypertension: Secondary | ICD-10-CM | POA: Diagnosis not present

## 2024-02-14 DIAGNOSIS — I2510 Atherosclerotic heart disease of native coronary artery without angina pectoris: Secondary | ICD-10-CM

## 2024-02-14 NOTE — Patient Instructions (Signed)
 Medication Instructions:  Your physician recommends that you continue on your current medications as directed. Please refer to the Current Medication list given to you today.  *If you need a refill on your cardiac medications before your next appointment, please call your pharmacy*   Lab Work: Lipids, BMET, CBC If you have labs (blood work) drawn today and your tests are completely normal, you will receive your results only by: MyChart Message (if you have MyChart) OR A paper copy in the mail If you have any lab test that is abnormal or we need to change your treatment, we will call you to review the results.   Follow-Up: At [facility], you and your health needs are our priority.  As part of our continuing mission to provide you with exceptional heart care, we have created designated Provider Care Teams.  These Care Teams include your primary Cardiologist (physician) and Advanced Practice Providers (APPs -  Physician Assistants and Nurse Practitioners) who all work together to provide you with the care you need, when you need it.  Your next appointment:   1 year(s)  Provider:   Thurmon Fair, MD

## 2024-02-14 NOTE — Progress Notes (Signed)
 Cardiology Office Note    Date:  02/19/2024   ID:  Meghan Griffith, DOB 1945-12-30, MRN [id number]  PCP:  Meghan Kill, PA-C  Cardiologist:   Meghan Fair, Griffith  Referred by: Meghan Griffith, PAC  No chief complaint on file.   History of Present Illness:  Meghan Griffith is a 78 y.o. female with lower extremity edema, extensive coronary calcifications on chest CT but with normal left ventricular systolic function, normal myocardial perfusion pattern and no history of angina.   She has no cardiovascular complaints.  She does complain of dizziness but this occurs with any change in head position and sounds like a vestibular problem.  It does not sound like orthostatic hypotension.  She has not been troubled by any worsening edema (she takes diuretics less than once a month).  And denies dyspnea at rest or with usual activities.  Her heart rate is rather fast today, but she she did use her inhaler a few hours ago.  Metabolic control is Griffith with LDL of 87 on labs performed last November.  She has a good HDL level at 57 and normal triglycerides.  She does not have diabetes mellitus.  She is on maximum dose atorvastatin.  She has labs consistent with moderate renal dysfunction.  Most recent creatinine was 1.46.  Her ECG has shown an incomplete left bundle branch block for a long time, usually with a left posterior fascicular block pattern.  ECG today shows complete left bundle branch block.  Her echo was interpreted as showing grade 1 diastolic dysfunction, but I think it was normal. Her lateral and medial diastolic annular velocities were 12 and 10 cm/second respectively, normal for her age.  Past Medical History:  Diagnosis Date   Arthritis    Cataracts, bilateral    Cholelithiasis    Chronic headache    Incontinence    Lower leg edema    Phlebitis    PVC (premature ventricular contraction)    SOB (shortness of breath)    Varicose veins    Venous  insufficiency (chronic) (peripheral)     Past Surgical History:  Procedure Laterality Date   CHOLECYSTECTOMY     collagen     injections   ENDOVENOUS ABLATION SAPHENOUS VEIN W/ LASER Right 02-28-2014   right greater saphenous vein and stab phlebectomy 10-20 incisions right leg  by Meghan Griffith   ENDOVENOUS ABLATION SAPHENOUS VEIN W/ LASER Left 07-18-2014   EVLA left greater saphenous vein, stab phlebectomy 10-20 incisions left leg , and sclerotherapy left leg     ENDOVENOUS ABLATION SAPHENOUS VEIN W/ LASER Left 08-01-2014   EVLA  left small saphenous vein  by Meghan Griffith   HERNIA REPAIR     INCISIONAL HERNIA REPAIR N/A 10/26/2013   Procedure: LAPAROSCOPIC INCISIONAL HERNIA;  Surgeon: Meghan Ina, Griffith;  Location: [facility];  Service: General;  Laterality: N/A;   INSERTION OF MESH N/A 10/26/2013   Procedure: INSERTION OF MESH;  Surgeon: Meghan Ina, Griffith;  Location: [facility];  Service: General;  Laterality: N/A;   KNEE ARTHROSCOPY  2002   left knee   LAPAROSCOPIC CHOLECYSTECTOMY W/ CHOLANGIOGRAPHY  07/30/11   NOSE SURGERY     TUBAL LIGATION  1976   vaginal sling      Current Medications: Outpatient Medications Prior to Visit  Medication Sig Dispense Refill   albuterol (VENTOLIN HFA) 108 (90 Base) MCG/ACT inhaler INHALE 1-2 PUFFS BY MOUTH EVERY 6 HOURS AS NEEDED FOR WHEEZE OR  SHORTNESS OF BREATH 18 each 1   atorvastatin (LIPITOR) 80 MG tablet TAKE 1 TABLET BY MOUTH DAILY AT 6 PM 90 tablet 3   Famotidine (PEPCID PO) Take 1 tablet by mouth daily at 6 (six) AM.     furosemide (LASIX) 40 MG tablet Take 1 tablet (40 mg total) by mouth daily as needed for fluid or edema. 30 tablet 6   olmesartan (BENICAR) 20 MG tablet Take 1 tablet (20 mg total) by mouth daily. 90 tablet 3   No facility-administered medications prior to visit.     Allergies:   Erythromycin base, Lansoprazole, Prednisone, and Tetracyclines & related   Family History:  The patient's family history includes COPD in her  father, mother, and sister; Cancer in her sister; Coronary artery disease in an other family member; Diabetes in her brother and mother; Heart disease in her brother, father, mother, and sister; Hypertension in her brother, father, mother, sister, and another family member; Other in her mother; Rheum arthritis in her mother.    PHYSICAL EXAM:   VS:  BP (!) 112/90 (BP Location: Left Arm, Patient Position: Sitting, Cuff Size: Large)   Pulse (!) 109   Ht 5\' 6"  (1.676 m)   Wt 231 lb 6.4 oz (105 kg)   SpO2 98%   BMI 37.35 kg/m      General: Alert, oriented x3, no distress, severely obese Head: no evidence of trauma, PERRL, EOMI, no exophtalmos or lid lag, no myxedema, no xanthelasma; normal ears, nose and oropharynx Neck: normal jugular venous pulsations and no hepatojugular reflux; brisk carotid pulses without delay and no carotid bruits Chest: clear to auscultation, no signs of consolidation by percussion or palpation, normal fremitus, symmetrical and full respiratory excursions Cardiovascular: normal position and quality of the apical impulse, regular rhythm, normal first and second heart sounds, no murmurs, rubs or gallops Abdomen: no tenderness or distention, no masses by palpation, no abnormal pulsatility or arterial bruits, normal bowel sounds, no hepatosplenomegaly Extremities: no clubbing, cyanosis or edema; 2+ radial, ulnar and brachial pulses bilaterally; 2+ right femoral, posterior tibial and dorsalis pedis pulses; 2+ left femoral, posterior tibial and dorsalis pedis pulses; no subclavian or femoral bruits Neurological: grossly nonfocal Psych: Normal mood and affect      Wt Readings from Last 3 Encounters:  02/14/24 231 lb 6.4 oz (105 kg)  10/20/23 234 lb 9.6 oz (106.4 kg)  10/14/22 229 lb 6.4 oz (104.1 kg)      Studies/Labs Reviewed:   EKG:    EKG Interpretation Date/Time:  Tuesday February 14 2024 15:25:38 EDT Ventricular Rate:  109 PR Interval:  162 QRS  Duration:  134 QT Interval:  386 QTC Calculation: 519 R Axis:   -42  Text Interpretation: Sinus tachycardia with Premature atrial complexes with Abberant conduction Left axis deviation Left bundle branch block When compared with ECG of 20-Oct-2023 15:00, PR interval has decreased Left posterior fascicular block is no longer Present Left bundle branch block has replaced Incomplete left bundle branch block Confirmed by Ashan Cueva ([id number]) on 02/14/2024 6:17:46 PM         Recent Labs:    Latest Ref Rng & Units 02/14/2024    4:24 PM 10/20/2023    4:04 PM 10/14/2022    4:12 PM  BMP  Glucose 70 - 99 mg/dL 85  88  85   BUN 8 - 27 mg/dL 32  32  26   Creatinine 0.57 - 1.00 mg/dL 9.14  7.82  9.56   BUN/Creat  Ratio 12 - 28 23  22  20    Sodium 134 - 144 mmol/L 141  138  140   Potassium 3.5 - 5.2 mmol/L 4.8  4.9  4.3   Chloride 96 - 106 mmol/L 104  101  102   CO2 20 - 29 mmol/L 21  19  20    Calcium 8.7 - 10.3 mg/dL 9.9  9.9  34.7        Component Value Date/Time   CHOL 136 02/14/2024 1624   TRIG 75 02/14/2024 1624   HDL 51 02/14/2024 1624   CHOLHDL 2.7 02/14/2024 1624   CHOLHDL 2.9 12/21/2017 1020   VLDL 11 12/28/2016 1047   LDLCALC 70 02/14/2024 1624   LDLCALC 79 12/21/2017 1020    ASSESSMENT:    1. Chronic right heart failure (HCC)   2. Essential hypertension   3. Coronary artery calcification   4. Aortic atherosclerosis (HCC)   5. Hypercholesterolemia   6. Chronic obstructive pulmonary disease, unspecified COPD type (HCC)   7. Tachycardia   8. Stage 3b chronic kidney disease (HCC)   9. Thrombocytopenia (HCC)   10. Medication management   11. Abnormal laboratory test      PLAN:  In order of problems listed above:  CHF: She has not had any manifestations of heart failure recently and takes diuretics less than once a month, simply by following a low-sodium diet.  Based on my review of her echocardiogram I really do not think she is at left ventricular systolic or  diastolic dysfunction, but she may have some degree of right heart failure, possibly related to a 30-pack-year history of smoking and COPD. HTN: Well-controlled even after we reduced her dose of olmesartan. Coronary calcification: She does not have angina and had a previous low risk nuclear stress test.  The focus is on risk factor modification.   Aortic atherosclerosis: Incidentally detected on imaging studies.  No clinical evidence of CAD or PAD, normal caliber aorta. HLP: lab results from today's visit show LDL at target at 70.  She is on maximum dose of atorvastatin, will continue. COPD: She is not often using her albuterol inhaler, but this may explain her tachycardia today.  She has quit smoking. CKD 3B: Stable GFR around 40-45 on labs performed today. Thrombocytopenia: Stable mild longstanding thrombocytopenia, documented as far back as 2009.  Normal B12 levels.  No bleeding problems     Medication Adjustments/Labs and Tests Ordered: Current medicines are reviewed at length with the patient today.  Concerns regarding medicines are outlined above.  Medication changes, Labs and Tests ordered today are listed in the Patient Instructions below. Patient Instructions  Medication Instructions:  Your physician recommends that you continue on your current medications as directed. Please refer to the Current Medication list given to you today.  *If you need a refill on your cardiac medications before your next appointment, please call your pharmacy*   Lab Work: Lipids, BMET, CBC If you have labs (blood work) drawn today and your tests are completely normal, you will receive your results only by: MyChart Message (if you have MyChart) OR A paper copy in the mail If you have any lab test that is abnormal or we need to change your treatment, we will call you to review the results.   Follow-Up: At [facility], you and your health needs are our priority.  As part of our continuing  mission to provide you with exceptional heart care, we have created designated Provider Care Teams.  These Care Teams include your primary Cardiologist (physician) and Advanced Practice Providers (APPs -  Physician Assistants and Nurse Practitioners) who all work together to provide you with the care you need, when you need it.  Your next appointment:   1 year(s)  Provider:   Thurmon Fair, Griffith       Signed, Meghan Fair, Griffith  02/19/2024 2:18 PM    [facility] [address] [place], [place], [place]  [address] Phone: [phone]; Fax: [phone]

## 2024-02-15 LAB — BASIC METABOLIC PANEL
BUN/Creatinine Ratio: 23 (ref 12–28)
BUN: 32 mg/dL — ABNORMAL HIGH (ref 8–27)
CO2: 21 mmol/L (ref 20–29)
Calcium: 9.9 mg/dL (ref 8.7–10.3)
Chloride: 104 mmol/L (ref 96–106)
Creatinine, Ser: 1.42 mg/dL — ABNORMAL HIGH (ref 0.57–1.00)
Glucose: 85 mg/dL (ref 70–99)
Potassium: 4.8 mmol/L (ref 3.5–5.2)
Sodium: 141 mmol/L (ref 134–144)
eGFR: 39 mL/min/{1.73_m2} — ABNORMAL LOW (ref >59)

## 2024-02-15 LAB — LIPID PANEL
Chol/HDL Ratio: 2.7 ratio (ref 0.0–4.4)
Cholesterol, Total: 136 mg/dL (ref 100–199)
HDL: 51 mg/dL (ref >39)
LDL Chol Calc (NIH): 70 mg/dL (ref 0–99)
Triglycerides: 75 mg/dL (ref 0–149)
VLDL Cholesterol Cal: 15 mg/dL (ref 5–40)

## 2024-02-15 LAB — CBC
Hematocrit: 46 % (ref 34.0–46.6)
Hemoglobin: 14.9 g/dL (ref 11.1–15.9)
MCH: 28.4 pg (ref 26.6–33.0)
MCHC: 32.4 g/dL (ref 31.5–35.7)
MCV: 88 fL (ref 79–97)
Platelets: 114 10*3/uL — ABNORMAL LOW (ref 150–450)
RBC: 5.25 x10E6/uL (ref 3.77–5.28)
RDW: 12.7 % (ref 11.7–15.4)
WBC: 5.6 10*3/uL (ref 3.4–10.8)

## 2024-02-19 DIAGNOSIS — I50812 Chronic right heart failure: Secondary | ICD-10-CM | POA: Insufficient documentation

## 2024-02-19 DIAGNOSIS — J449 Chronic obstructive pulmonary disease, unspecified: Secondary | ICD-10-CM | POA: Insufficient documentation

## 2024-02-19 DIAGNOSIS — N1832 Chronic kidney disease, stage 3b: Secondary | ICD-10-CM | POA: Insufficient documentation

## 2024-09-25 ENCOUNTER — Other Ambulatory Visit: Payer: Self-pay | Admitting: Student

## 2024-12-17 ENCOUNTER — Other Ambulatory Visit: Payer: Self-pay | Admitting: Student

## 2024-12-27 ENCOUNTER — Other Ambulatory Visit: Payer: Self-pay

## 2025-01-03 MED ORDER — OLMESARTAN MEDOXOMIL 20 MG PO TABS: 20.0000 mg | ORAL_TABLET | Freq: Every day | ORAL | 3 refills | Status: AC

## 2025-01-03 NOTE — Telephone Encounter (Signed)
 At Labs on 02/14/24, Creatinine was outside Normal Range  In accordance with refill protocols, please review and address the following requirements before this medication refill can be authorized:  Labs
